# Patient Record
Sex: Female | Born: 1980 | Race: White | Hispanic: No | Marital: Married | Smoking: Former smoker
Health system: Southern US, Community
[De-identification: ages and names within clinical notes are randomized; demographics above are authoritative.]

## PROBLEM LIST (undated history)

## (undated) DIAGNOSIS — N200 Calculus of kidney: Secondary | ICD-10-CM

## (undated) DIAGNOSIS — G43019 Migraine without aura, intractable, without status migrainosus: Secondary | ICD-10-CM

## (undated) DIAGNOSIS — R109 Unspecified abdominal pain: Secondary | ICD-10-CM

## (undated) DIAGNOSIS — C801 Malignant (primary) neoplasm, unspecified: Secondary | ICD-10-CM

## (undated) DIAGNOSIS — G43909 Migraine, unspecified, not intractable, without status migrainosus: Secondary | ICD-10-CM

## (undated) DIAGNOSIS — Z227 Latent tuberculosis: Secondary | ICD-10-CM

## (undated) DIAGNOSIS — A64 Unspecified sexually transmitted disease: Secondary | ICD-10-CM

## (undated) DIAGNOSIS — C50919 Malignant neoplasm of unspecified site of unspecified female breast: Secondary | ICD-10-CM

## (undated) HISTORY — DX: Migraine, unspecified, not intractable, without status migrainosus: G43.909

## (undated) HISTORY — PX: BREAST EXCISIONAL BIOPSY: SUR124

## (undated) HISTORY — PX: OTHER SURGICAL HISTORY: SHX169

## (undated) HISTORY — DX: Unspecified abdominal pain: R10.9

## (undated) HISTORY — DX: Latent tuberculosis: Z22.7

## (undated) HISTORY — DX: Malignant neoplasm of unspecified site of unspecified female breast: C50.919

## (undated) HISTORY — DX: Calculus of kidney: N20.0

## (undated) HISTORY — DX: Unspecified sexually transmitted disease: A64

## (undated) HISTORY — DX: Migraine without aura, intractable, without status migrainosus: G43.019

---
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
## 1999-03-20 ENCOUNTER — Other Ambulatory Visit: Admission: RE | Admit: 1999-03-20 | Discharge: 1999-03-20 | Payer: Self-pay | Admitting: Obstetrics and Gynecology

## 1999-05-15 ENCOUNTER — Emergency Department (HOSPITAL_COMMUNITY): Admission: EM | Admit: 1999-05-15 | Discharge: 1999-05-15 | Payer: Self-pay | Admitting: Emergency Medicine

## 1999-08-09 ENCOUNTER — Emergency Department (HOSPITAL_COMMUNITY): Admission: EM | Admit: 1999-08-09 | Discharge: 1999-08-09 | Payer: Self-pay | Admitting: Emergency Medicine

## 1999-08-18 ENCOUNTER — Emergency Department (HOSPITAL_COMMUNITY): Admission: EM | Admit: 1999-08-18 | Discharge: 1999-08-18 | Payer: Self-pay | Admitting: Emergency Medicine

## 1999-08-19 ENCOUNTER — Encounter: Payer: Self-pay | Admitting: Emergency Medicine

## 2000-02-12 ENCOUNTER — Emergency Department (HOSPITAL_COMMUNITY): Admission: EM | Admit: 2000-02-12 | Discharge: 2000-02-12 | Payer: Self-pay | Admitting: *Deleted

## 2000-06-01 ENCOUNTER — Emergency Department (HOSPITAL_COMMUNITY): Admission: EM | Admit: 2000-06-01 | Discharge: 2000-06-01 | Payer: Self-pay | Admitting: Emergency Medicine

## 2000-09-22 ENCOUNTER — Emergency Department (HOSPITAL_COMMUNITY): Admission: EM | Admit: 2000-09-22 | Discharge: 2000-09-22 | Payer: Self-pay | Admitting: Emergency Medicine

## 2000-09-24 ENCOUNTER — Emergency Department (HOSPITAL_COMMUNITY): Admission: EM | Admit: 2000-09-24 | Discharge: 2000-09-25 | Payer: Self-pay

## 2000-09-24 ENCOUNTER — Encounter: Payer: Self-pay | Admitting: Emergency Medicine

## 2000-09-25 ENCOUNTER — Emergency Department (HOSPITAL_COMMUNITY): Admission: EM | Admit: 2000-09-25 | Discharge: 2000-09-25 | Payer: Self-pay | Admitting: *Deleted

## 2000-09-26 ENCOUNTER — Encounter: Payer: Self-pay | Admitting: *Deleted

## 2000-10-28 ENCOUNTER — Emergency Department (HOSPITAL_COMMUNITY): Admission: EM | Admit: 2000-10-28 | Discharge: 2000-10-29 | Payer: Self-pay | Admitting: Emergency Medicine

## 2000-11-12 ENCOUNTER — Inpatient Hospital Stay (HOSPITAL_COMMUNITY): Admission: EM | Admit: 2000-11-12 | Discharge: 2000-11-14 | Payer: Self-pay | Admitting: *Deleted

## 2001-01-01 ENCOUNTER — Inpatient Hospital Stay (HOSPITAL_COMMUNITY): Admission: AD | Admit: 2001-01-01 | Discharge: 2001-01-01 | Payer: Self-pay | Admitting: Obstetrics & Gynecology

## 2001-01-28 ENCOUNTER — Other Ambulatory Visit: Admission: RE | Admit: 2001-01-28 | Discharge: 2001-01-28 | Payer: Self-pay | Admitting: Obstetrics and Gynecology

## 2001-03-29 ENCOUNTER — Inpatient Hospital Stay (HOSPITAL_COMMUNITY): Admission: AD | Admit: 2001-03-29 | Discharge: 2001-03-29 | Payer: Self-pay | Admitting: Obstetrics and Gynecology

## 2001-07-01 ENCOUNTER — Inpatient Hospital Stay (HOSPITAL_COMMUNITY): Admission: AD | Admit: 2001-07-01 | Discharge: 2001-07-01 | Payer: Self-pay | Admitting: Obstetrics and Gynecology

## 2001-08-27 ENCOUNTER — Inpatient Hospital Stay (HOSPITAL_COMMUNITY): Admission: AD | Admit: 2001-08-27 | Discharge: 2001-08-30 | Payer: Self-pay | Admitting: *Deleted

## 2002-05-27 ENCOUNTER — Emergency Department (HOSPITAL_COMMUNITY): Admission: EM | Admit: 2002-05-27 | Discharge: 2002-05-27 | Payer: Self-pay | Admitting: Emergency Medicine

## 2002-05-27 ENCOUNTER — Encounter: Payer: Self-pay | Admitting: Emergency Medicine

## 2002-06-07 ENCOUNTER — Other Ambulatory Visit: Admission: RE | Admit: 2002-06-07 | Discharge: 2002-06-07 | Payer: Self-pay | Admitting: Obstetrics and Gynecology

## 2002-07-05 ENCOUNTER — Encounter: Admission: RE | Admit: 2002-07-05 | Discharge: 2002-07-05 | Payer: Self-pay | Admitting: Obstetrics and Gynecology

## 2002-07-05 ENCOUNTER — Encounter: Payer: Self-pay | Admitting: Obstetrics and Gynecology

## 2003-07-08 HISTORY — PX: BREAST SURGERY: SHX581

## 2003-10-18 ENCOUNTER — Other Ambulatory Visit: Admission: RE | Admit: 2003-10-18 | Discharge: 2003-10-18 | Payer: Self-pay | Admitting: Obstetrics and Gynecology

## 2003-10-27 ENCOUNTER — Encounter: Admission: RE | Admit: 2003-10-27 | Discharge: 2003-10-27 | Payer: Self-pay | Admitting: Obstetrics and Gynecology

## 2003-11-08 ENCOUNTER — Encounter (INDEPENDENT_AMBULATORY_CARE_PROVIDER_SITE_OTHER): Payer: Self-pay | Admitting: *Deleted

## 2003-11-08 ENCOUNTER — Ambulatory Visit (HOSPITAL_COMMUNITY): Admission: RE | Admit: 2003-11-08 | Discharge: 2003-11-08 | Payer: Self-pay | Admitting: *Deleted

## 2003-11-08 ENCOUNTER — Ambulatory Visit (HOSPITAL_BASED_OUTPATIENT_CLINIC_OR_DEPARTMENT_OTHER): Admission: RE | Admit: 2003-11-08 | Discharge: 2003-11-08 | Payer: Self-pay | Admitting: *Deleted

## 2004-01-30 ENCOUNTER — Other Ambulatory Visit: Payer: Self-pay

## 2004-07-13 ENCOUNTER — Emergency Department: Payer: Self-pay | Admitting: Emergency Medicine

## 2004-08-17 ENCOUNTER — Emergency Department (HOSPITAL_COMMUNITY): Admission: EM | Admit: 2004-08-17 | Discharge: 2004-08-17 | Payer: Self-pay | Admitting: Emergency Medicine

## 2004-12-07 ENCOUNTER — Emergency Department: Payer: Self-pay | Admitting: Emergency Medicine

## 2005-05-19 ENCOUNTER — Emergency Department (HOSPITAL_COMMUNITY): Admission: EM | Admit: 2005-05-19 | Discharge: 2005-05-19 | Payer: Self-pay | Admitting: Emergency Medicine

## 2005-11-05 ENCOUNTER — Encounter: Payer: Self-pay | Admitting: Emergency Medicine

## 2006-10-14 ENCOUNTER — Emergency Department (HOSPITAL_COMMUNITY): Admission: EM | Admit: 2006-10-14 | Discharge: 2006-10-14 | Payer: Self-pay | Admitting: Emergency Medicine

## 2006-11-15 ENCOUNTER — Emergency Department (HOSPITAL_COMMUNITY): Admission: EM | Admit: 2006-11-15 | Discharge: 2006-11-15 | Payer: Self-pay | Admitting: Emergency Medicine

## 2007-11-01 ENCOUNTER — Emergency Department: Payer: Self-pay | Admitting: Emergency Medicine

## 2007-11-10 ENCOUNTER — Ambulatory Visit: Payer: Self-pay | Admitting: Gastroenterology

## 2008-05-26 ENCOUNTER — Inpatient Hospital Stay (HOSPITAL_COMMUNITY): Admission: AD | Admit: 2008-05-26 | Discharge: 2008-05-26 | Payer: Self-pay | Admitting: Family Medicine

## 2008-05-26 ENCOUNTER — Ambulatory Visit: Payer: Self-pay | Admitting: Physician Assistant

## 2008-09-18 ENCOUNTER — Inpatient Hospital Stay (HOSPITAL_COMMUNITY): Admission: AD | Admit: 2008-09-18 | Discharge: 2008-09-18 | Payer: Self-pay | Admitting: Family Medicine

## 2008-11-11 ENCOUNTER — Emergency Department (HOSPITAL_COMMUNITY): Admission: EM | Admit: 2008-11-11 | Discharge: 2008-11-11 | Payer: Self-pay | Admitting: Obstetrics & Gynecology

## 2009-01-14 ENCOUNTER — Inpatient Hospital Stay (HOSPITAL_COMMUNITY): Admission: AD | Admit: 2009-01-14 | Discharge: 2009-01-15 | Payer: Self-pay | Admitting: Obstetrics & Gynecology

## 2009-01-14 ENCOUNTER — Ambulatory Visit: Payer: Self-pay | Admitting: Physician Assistant

## 2009-11-26 ENCOUNTER — Other Ambulatory Visit: Admission: RE | Admit: 2009-11-26 | Discharge: 2009-11-26 | Payer: Self-pay | Admitting: Obstetrics & Gynecology

## 2010-10-13 LAB — RH IMMUNE GLOB WKUP(>/=20WKS)(NOT WOMEN'S HOSP)

## 2010-10-13 LAB — CBC
Hemoglobin: 12.1 g/dL (ref 12.0–15.0)
MCHC: 34.7 g/dL (ref 30.0–36.0)
MCV: 92.5 fL (ref 78.0–100.0)
Platelets: 266 10*3/uL (ref 150–400)
RBC: 3.76 MIL/uL — ABNORMAL LOW (ref 3.87–5.11)
RDW: 14.3 % (ref 11.5–15.5)
WBC: 12.2 10*3/uL — ABNORMAL HIGH (ref 4.0–10.5)

## 2010-10-17 LAB — WET PREP, GENITAL
Clue Cells Wet Prep HPF POC: NONE SEEN
Trich, Wet Prep: NONE SEEN

## 2010-10-17 LAB — URINALYSIS, ROUTINE W REFLEX MICROSCOPIC
Bilirubin Urine: NEGATIVE
Specific Gravity, Urine: 1.01 (ref 1.005–1.030)
Urobilinogen, UA: 0.2 mg/dL (ref 0.0–1.0)

## 2010-10-17 LAB — GC/CHLAMYDIA PROBE AMP, GENITAL: GC Probe Amp, Genital: NEGATIVE

## 2010-10-30 ENCOUNTER — Emergency Department (HOSPITAL_COMMUNITY): Payer: Self-pay

## 2010-10-30 ENCOUNTER — Emergency Department (HOSPITAL_COMMUNITY)
Admission: EM | Admit: 2010-10-30 | Discharge: 2010-10-30 | Disposition: A | Discharge status: Home / Self Care | Payer: Self-pay | Attending: Emergency Medicine | Admitting: Emergency Medicine

## 2010-10-30 DIAGNOSIS — M79609 Pain in unspecified limb: Secondary | ICD-10-CM | POA: Insufficient documentation

## 2010-10-30 DIAGNOSIS — IMO0002 Reserved for concepts with insufficient information to code with codable children: Secondary | ICD-10-CM | POA: Insufficient documentation

## 2010-10-30 DIAGNOSIS — J45909 Unspecified asthma, uncomplicated: Secondary | ICD-10-CM | POA: Insufficient documentation

## 2010-10-30 MED ORDER — GADOBENATE DIMEGLUMINE 529 MG/ML IV SOLN: 10.0000 mL | Freq: Once | INTRAVENOUS | Status: DC | PRN
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
## 2010-11-22 NOTE — Op Note (Signed)
NAME:  EVANGELINE, UTLEY NO.:  [id number]   MEDICAL RECORD NO.:  [id number]                   PATIENT TYPE:  AMB   LOCATION:  DSC                                  FACILITY:  [facility]   PHYSICIAN:  Vikki Ports, M.D.         DATE OF BIRTH:  04-24-1981   DATE OF PROCEDURE:  11/08/2003  DATE OF DISCHARGE:                                 OPERATIVE REPORT   PREOPERATIVE DIAGNOSIS:  Right breast mass.   POSTOPERATIVE DIAGNOSIS:  Right breast mass.   PROCEDURE:  Excisional right breast biopsy.   SURGEON:  Vikki Ports, M.D.   ANESTHESIA:  Local MAC.   DESCRIPTION OF PROCEDURE:  The patient was taken to the operating room and  placed in a supine position.  After adequate MAC anesthesia was induced, the  right breast was prepped and draped in the normal sterile fashion.  Using 1%  lidocaine local anesthesia, the skin overlying the mass was anesthetized.  This was in the 9 o'clock region of the right breast.  A transverse incision  was made over the palpable mass, dissected down, excising the mass in its  entirety.  It was quite small but firm and sent for pathological evaluation.  Adequate hemostasis was assured.  The skin was closed with subcuticular 4-0  Monocryl.  Steri-Strips and sterile dressings were applied.  The patient  tolerated the procedure well and went to the PACU in good condition.                                               Vikki Ports, M.D.    KRH/MEDQ  D:  11/08/2003  T:  11/08/2003  Job:  [id number]
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
## 2011-03-18 ENCOUNTER — Other Ambulatory Visit (HOSPITAL_COMMUNITY)
Admission: RE | Admit: 2011-03-18 | Discharge: 2011-03-18 | Disposition: A | Discharge status: Home / Self Care | Payer: Self-pay | Source: Ambulatory Visit | Attending: Obstetrics & Gynecology | Admitting: Obstetrics & Gynecology

## 2011-03-18 ENCOUNTER — Other Ambulatory Visit: Payer: Self-pay | Admitting: Obstetrics & Gynecology

## 2011-03-18 DIAGNOSIS — Z01419 Encounter for gynecological examination (general) (routine) without abnormal findings: Secondary | ICD-10-CM | POA: Insufficient documentation

## 2011-04-08 LAB — CBC
Hemoglobin: 13.2
MCHC: 34.3
MCV: 91.1
Platelets: 264
RBC: 4.23
RDW: 13.4
WBC: 10.6 — ABNORMAL HIGH

## 2011-04-08 LAB — URINALYSIS, ROUTINE W REFLEX MICROSCOPIC
Bilirubin Urine: NEGATIVE
Glucose, UA: NEGATIVE
Hgb urine dipstick: NEGATIVE
Nitrite: NEGATIVE
Protein, ur: NEGATIVE
Specific Gravity, Urine: 1.01
Urobilinogen, UA: 0.2

## 2011-04-08 LAB — WET PREP, GENITAL: Yeast Wet Prep HPF POC: NONE SEEN

## 2011-08-24 ENCOUNTER — Emergency Department (INDEPENDENT_AMBULATORY_CARE_PROVIDER_SITE_OTHER): Payer: Commercial Managed Care - PPO

## 2011-08-24 ENCOUNTER — Encounter (HOSPITAL_COMMUNITY): Payer: Self-pay

## 2011-08-24 ENCOUNTER — Emergency Department (INDEPENDENT_AMBULATORY_CARE_PROVIDER_SITE_OTHER)
Admission: EM | Admit: 2011-08-24 | Discharge: 2011-08-24 | Disposition: A | Discharge status: Home / Self Care | Payer: Commercial Managed Care - PPO | Source: Home / Self Care | Attending: Family Medicine | Admitting: Family Medicine

## 2011-08-24 DIAGNOSIS — M546 Pain in thoracic spine: Secondary | ICD-10-CM

## 2011-08-24 MED ORDER — NAPROXEN 375 MG PO TABS: 375.0000 mg | ORAL_TABLET | Freq: Two times a day (BID) | ORAL | Status: DC

## 2011-08-24 MED ORDER — HYDROCODONE-ACETAMINOPHEN 5-325 MG PO TABS: ORAL_TABLET | ORAL | Status: AC

## 2011-08-24 NOTE — ED Notes (Signed)
Pt states she has pain between shoulder blades since last pm, no known injury.  Hurts to raise her arms.

## 2011-08-24 NOTE — ED Provider Notes (Signed)
History     CSN: [id number]  Arrival date & time 08/24/11  1310   First MD Initiated Contact with Patient 08/24/11 1420      Chief Complaint  Patient presents with  . Back Pain    (Consider location/radiation/quality/duration/timing/severity/associated sxs/prior treatment) HPI Comments: Dan presents for evaluation of back pain. She reports pain in the middle of her back in the thoracic area. She denies any injury. 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 Her 2 daughters. She denies any numbness, tingling, or weakness in her upper extremities. She does report pain and numbness in her right thigh. She reports a previous injury to her lumbar spine last year.  Patient is a 31 y.o. female presenting with back pain. The history is provided by the patient.  Back Pain  This is a new problem. The current episode started 6 to 12 hours ago. The problem occurs constantly. The problem has not changed since onset.The pain is associated with lifting heavy objects. The pain is present in the thoracic spine. The pain does not radiate. The symptoms are aggravated by bending and certain positions. Pertinent negatives include no numbness, no weight loss, no bladder incontinence, no paresthesias, no paresis, no tingling and no weakness. She has tried nothing for the symptoms.    Past Medical History  Diagnosis Date  . Asthma     History reviewed. No pertinent past surgical history.  History reviewed. No pertinent family history.  History  Substance Use Topics  . Smoking status: Never Smoker   . Smokeless tobacco: Not on file  . Alcohol Use: Yes    OB History    Grav Para Term Preterm Abortions TAB SAB Ect Mult Living                  Review of Systems  Constitutional: Negative.  Negative for weight loss.  HENT: Negative.   Eyes: Negative.   Respiratory: Negative.   Cardiovascular: Negative.   Gastrointestinal: Negative.     Genitourinary: Negative.  Negative for bladder incontinence.  Musculoskeletal: Positive for back pain.  Skin: Negative.   Neurological: Negative.  Negative for tingling, weakness, numbness and paresthesias.    Allergies  Penicillins  Home Medications   Current Outpatient Rx  Name Route Sig Dispense Refill  . HYDROCODONE-ACETAMINOPHEN 5-325 MG PO TABS  Take one to two tablets every 4 to 6 hours as needed for pain 20 tablet 0  . NAPROXEN 375 MG PO TABS Oral Take 1 tablet (375 mg total) by mouth 2 (two) times daily. 20 tablet 0    BP 105/68  Pulse 71  Temp(Src) 98.2 F (36.8 C) (Oral)  Resp 18  SpO2 100%  Physical Exam  Nursing note and vitals reviewed. Constitutional: She is oriented to person, place, and time. She appears well-developed and well-nourished.  HENT:  Head: Normocephalic and atraumatic.  Eyes: EOM are normal.  Neck: Normal range of motion.  Pulmonary/Chest: Effort normal.  Musculoskeletal: Normal range of motion.       Thoracic back: She exhibits tenderness, bony tenderness and pain.       Back:  Neurological: She is alert and oriented to person, place, and time.  Skin: Skin is warm and dry.  Psychiatric: Her behavior is normal.    ED Course  Procedures (including critical care time)  Labs Reviewed - No data to display Dg Thoracic Spine 2 View  08/24/2011  *RADIOLOGY REPORT*  Clinical Data:  Pain without trauma.  THORACIC SPINE - 2 VIEW  Comparison: None.  Findings: Frontal view demonstrates minimal S-shaped spinal curvature.  Normal paraspinous contours.  The swimmer's view demonstrates maintained vertebral body height through T5.  The lateral view images from approximately T1-L1. Maintenance of height across these levels. Intervertebral disc heights are maintained.  IMPRESSION: Minimal S-shaped spinal curvature. No acute osseous abnormality.  Original Report Authenticated By: Consuello Bossier, M.D.     1. Thoracic back pain       MDM  Xray reviewed  by radiologist and myself; no acute findings        Richardo Priest, MD 08/24/11 [phone]

## 2011-08-24 NOTE — Discharge Instructions (Signed)
Your x-ray was negative for any fracture. Take medications as directed. Use mild heat (heating pad, warm baths, etc) for 10 to 15 minutes, two to three times daily, as needed and as tolerated, taking care to not burn the skin. Begin stretches and exercises, as instructed in handouts, after 48 hours. Return to care should your symptoms not improve, or worsen in any way, such as numbness, weakness, or tingling, or inability to control urine or bowel movements; or follow up with the Orthopaedic provider listed on your discharge papers.

## 2012-02-13 ENCOUNTER — Emergency Department (HOSPITAL_COMMUNITY)
Admission: EM | Admit: 2012-02-13 | Discharge: 2012-02-13 | Disposition: A | Discharge status: Home / Self Care | Payer: Commercial Managed Care - PPO | Attending: Emergency Medicine | Admitting: Emergency Medicine

## 2012-02-13 ENCOUNTER — Encounter (HOSPITAL_COMMUNITY): Payer: Self-pay | Admitting: Emergency Medicine

## 2012-02-13 ENCOUNTER — Emergency Department (HOSPITAL_COMMUNITY): Payer: Commercial Managed Care - PPO

## 2012-02-13 DIAGNOSIS — R109 Unspecified abdominal pain: Secondary | ICD-10-CM

## 2012-02-13 DIAGNOSIS — J45909 Unspecified asthma, uncomplicated: Secondary | ICD-10-CM | POA: Insufficient documentation

## 2012-02-13 DIAGNOSIS — R1011 Right upper quadrant pain: Secondary | ICD-10-CM | POA: Insufficient documentation

## 2012-02-13 DIAGNOSIS — R079 Chest pain, unspecified: Secondary | ICD-10-CM

## 2012-02-13 DIAGNOSIS — Z7982 Long term (current) use of aspirin: Secondary | ICD-10-CM | POA: Insufficient documentation

## 2012-02-13 LAB — CBC
HCT: 39.3 % (ref 36.0–46.0)
Hemoglobin: 13.6 g/dL (ref 12.0–15.0)
MCHC: 34.6 g/dL (ref 30.0–36.0)
RDW: 13.2 % (ref 11.5–15.5)
WBC: 10.7 10*3/uL — ABNORMAL HIGH (ref 4.0–10.5)

## 2012-02-13 LAB — POCT I-STAT TROPONIN I

## 2012-02-13 LAB — POCT I-STAT, CHEM 8
Chloride: 106 mEq/L (ref 96–112)
Glucose, Bld: 107 mg/dL — ABNORMAL HIGH (ref 70–99)
HCT: 41 % (ref 36.0–46.0)
Hemoglobin: 13.9 g/dL (ref 12.0–15.0)
Potassium: 3.3 mEq/L — ABNORMAL LOW (ref 3.5–5.1)
Sodium: 141 mEq/L (ref 135–145)

## 2012-02-13 LAB — LIPASE, BLOOD: Lipase: 29 U/L (ref 11–59)

## 2012-02-13 LAB — HEPATIC FUNCTION PANEL
Albumin: 3.8 g/dL (ref 3.5–5.2)
Indirect Bilirubin: 0.4 mg/dL (ref 0.3–0.9)
Total Protein: 6.6 g/dL (ref 6.0–8.3)

## 2012-02-13 MED ORDER — ONDANSETRON HCL 4 MG/2ML IJ SOLN
4.0000 mg | Freq: Once | INTRAMUSCULAR | Status: AC
  Administered 2012-02-13: 4 mg via INTRAVENOUS
  Filled 2012-02-13: qty 2

## 2012-02-13 MED ORDER — HYDROMORPHONE HCL PF 1 MG/ML IJ SOLN
1.0000 mg | Freq: Once | INTRAMUSCULAR | Status: AC
Start: 2012-02-13 — End: 2012-02-13
  Administered 2012-02-13: 1 mg via INTRAVENOUS
  Filled 2012-02-13: qty 1

## 2012-02-13 MED ORDER — HYDROCODONE-ACETAMINOPHEN 5-325 MG PO TABS
ORAL_TABLET | ORAL | Status: AC
Start: 2012-02-13 — End: 2012-02-23

## 2012-02-13 MED ORDER — ONDANSETRON 8 MG PO TBDP: 8.0000 mg | ORAL_TABLET | Freq: Three times a day (TID) | ORAL | Status: AC | PRN

## 2012-02-13 NOTE — ED Notes (Signed)
Iv d'cd without incident. 

## 2012-02-13 NOTE — ED Provider Notes (Signed)
Medical screening examination/treatment/procedure(s) were performed by non-physician practitioner and as supervising physician I was immediately available for consultation/collaboration.   Richardean Canal, MD 02/13/12 2328

## 2012-02-13 NOTE — ED Notes (Signed)
Cp started yesterday; not really heartburn.  Some sob. And tightness. Mild dizziness. Hx. Of anxiety.

## 2012-02-13 NOTE — ED Provider Notes (Signed)
History     CSN: [id number]  Arrival date & time 02/13/12  1514   First MD Initiated Contact with Patient 02/13/12 2003      Chief Complaint  Patient presents with  . Chest Pain    (Consider location/radiation/quality/duration/timing/severity/associated sxs/prior treatment) HPI Comments: Patient presents with approximately 24-hour history of midsternal chest aching and upper abdominal pain. Patient called her primary care physician today and was told to come to the emergency department for further evaluation of her symptoms. Patient states the pain will radiate to her mid back. It is positioned with nausea. No diaphoresis, palpitations, significant shortness of breath. Patient has taken aspirin without relief of symptoms. Patient denies fever or changes in her bowel movements. The pain is not made worse with eating or deep breathing. Patient is negative. Onset was acute. Course is constant. Nothing makes the symptoms better or worse.  Patient is a 31 y.o. female presenting with chest pain. The history is provided by the patient.  Chest Pain The chest pain began yesterday. Chest pain occurs constantly. The chest pain is unchanged. The pain is associated with breathing. The quality of the pain is described as aching. The pain radiates to the mid back. Primary symptoms include abdominal pain and nausea. Pertinent negatives for primary symptoms include no fever, no shortness of breath, no cough, no palpitations and no vomiting.  Pertinent negatives for associated symptoms include no diaphoresis. She tried aspirin for the symptoms.  Pertinent negatives for past medical history include no diabetes, no DVT, no hyperlipidemia and no hypertension.  Pertinent negatives for family medical history include: no early MI in family and no PE in family.     Past Medical History  Diagnosis Date  . Asthma     No past surgical history on file.  No family history on file.  History  Substance Use Topics   . Smoking status: Never Smoker   . Smokeless tobacco: Not on file  . Alcohol Use: Yes    OB History    Grav Para Term Preterm Abortions TAB SAB Ect Mult Living                  Review of Systems  Constitutional: Negative for fever and diaphoresis.  HENT: Negative for sore throat, rhinorrhea and neck pain.   Eyes: Negative for redness.  Respiratory: Negative for cough and shortness of breath.   Cardiovascular: Positive for chest pain. Negative for palpitations and leg swelling.  Gastrointestinal: Positive for nausea and abdominal pain. Negative for vomiting and diarrhea.  Genitourinary: Negative for dysuria.  Musculoskeletal: Negative for myalgias and back pain.  Skin: Negative for rash.  Neurological: Negative for syncope, light-headedness and headaches.    Allergies  Penicillins  Home Medications   Current Outpatient Rx  Name Route Sig Dispense Refill  . ASPIRIN 81 MG PO CHEW Oral Chew 324 mg by mouth daily as needed. For chest pain      BP 103/68  Pulse 59  Temp 98.1 F (36.7 C) (Oral)  Resp 17  SpO2 98%  Physical Exam  Nursing note and vitals reviewed. Constitutional: She appears well-developed and well-nourished.  HENT:  Head: Normocephalic and atraumatic.  Eyes: Conjunctivae are normal. Right eye exhibits no discharge. Left eye exhibits no discharge.  Neck: Normal range of motion. Neck supple.  Cardiovascular: Normal rate, regular rhythm and normal heart sounds.   Pulmonary/Chest: Effort normal and breath sounds normal. No respiratory distress. She exhibits no tenderness.  Abdominal: Soft. Bowel sounds are  normal. She exhibits no distension. There is tenderness in the right upper quadrant and epigastric area. There is no rigidity, no rebound, no guarding, no CVA tenderness, no tenderness at McBurney's point and negative Murphy's sign.    Musculoskeletal: She exhibits no edema and no tenderness.       No lower extremity tenderness or swelling.     Neurological: She is alert.  Skin: Skin is warm and dry.  Psychiatric: She has a normal mood and affect.    ED Course  Procedures (including critical care time)  Labs Reviewed  CBC - Abnormal; Notable for the following:    WBC 10.7 (*)     All other components within normal limits  POCT I-STAT, CHEM 8 - Abnormal; Notable for the following:    Potassium 3.3 (*)     Glucose, Bld 107 (*)     All other components within normal limits  POCT I-STAT TROPONIN I  HEPATIC FUNCTION PANEL  LIPASE, BLOOD   Dg Chest 2 View  02/13/2012  *RADIOLOGY REPORT*  Clinical Data: Chest pain and shortness of breath  CHEST - 2 VIEW  Comparison: None  Findings: The heart size and mediastinal contours are within normal limits.  Both lungs are clear.  The visualized skeletal structures are unremarkable.  IMPRESSION: Negative exam.  Original Report Authenticated By: Rosealee Albee, M.D.   US Abdomen Complete  02/13/2012  *RADIOLOGY REPORT*  Clinical Data:  Right upper quadrant pain  COMPLETE ABDOMINAL ULTRASOUND  Comparison:  None.  Findings:  Gallbladder:  No gallstones, gallbladder wall thickening, or pericholecystic fluid.  Common bile duct:  Normal at 2 mm  Liver:  No focal lesion identified.  Within normal limits in parenchymal echogenicity.  IVC:  Appears normal.  Pancreas:  No focal abnormality seen.  Spleen:  Normal in size and echogenicity.  Right Kidney:  11.2cm in length.  No evidence of hydronephrosis or stones.  Left Kidney:  11.2cm in length.  No evidence of hydronephrosis or stones.  Abdominal aorta:  No aneurysm identified.  IMPRESSION: Normal abdominal ultrasound.  Original Report Authenticated By: Genevive Bi, M.D.     1. Abdominal pain   2. Chest pain     8:43 PM Patient seen and examined. Additional labs and [place] ordered. Medications ordered.  Will move to CDU.    Date: 02/13/2012  Rate:87  Rhythm: normal sinus rhythm and sinus arrhythmia  QRS Axis: normal  Intervals: normal  ST/T  Wave abnormalities: normal  Conduction Disutrbances:none  Narrative Interpretation:   Old EKG Reviewed: none available    Vital signs reviewed and are as follows: Filed Vitals:   02/13/12 1947  BP: 103/68  Pulse: 59  Temp:   Resp: 17   Patient never made it to CDU. Ultrasound and blood tests findings reviewed by myself and reviewed with patient. She is feeling improved after pain medicine and nausea medicine. She is reassured.  The patient was urged to return to the Emergency Department immediately with worsening of current symptoms, worsening abdominal pain, persistent vomiting, blood noted in stools, fever, or any other concerns. The patient verbalized understanding.   Patient was counseled to return with severe chest pain, especially if the pain is crushing or pressure-like and spreads to the arms, back, neck, or jaw, or if they have sweating, nausea, or shortness of breath with the pain. They were encouraged to call 911 with these symptoms.   They were also told to return if their chest pain gets worse and does  not go away with rest, they have an attack of chest pain lasting longer than usual despite rest and treatment with the medications their caregiver has prescribed, if they wake from sleep with chest pain or shortness of breath, if they feel dizzy or faint, if they have chest pain not typical of their usual pain, or if they have any other emergent concerns regarding their health.  The patient verbalized understanding and agreed.   Patient counseled on use of narcotic pain medications. Counseled not to combine these medications with others containing tylenol. Urged not to drink alcohol, drive, or perform any other activities that requires focus while taking these medications. The patient verbalizes understanding and agrees with the plan.   MDM  Patient with lower sternal chest pain and upper abdominal pain. Her risk factor profile is low risk for CAD. EKG normal and troponin  negative. Do not suspect ACS. Patient is PERC negative. Do not suspect PE. Patient had right upper quadrant tenderness. This was further evaluated with blood tests and an ultrasound was negative. Do not suspect cholecystitis, pancreatitis. Patient appears well at current time. She is afebrile. Her symptoms are controlled in emergency department. She is tolerating orals. She can be properly managed as an outpatient with her primary care physician.         Renne Crigler, [place] 02/13/12 2308

## 2012-02-13 NOTE — ED Notes (Signed)
Pt to ultrasound at this time.

## 2012-05-06 ENCOUNTER — Ambulatory Visit (INDEPENDENT_AMBULATORY_CARE_PROVIDER_SITE_OTHER): Payer: Commercial Managed Care - PPO | Admitting: Otolaryngology

## 2012-05-06 DIAGNOSIS — H6120 Impacted cerumen, unspecified ear: Secondary | ICD-10-CM

## 2012-05-06 DIAGNOSIS — H9209 Otalgia, unspecified ear: Secondary | ICD-10-CM

## 2012-07-07 DIAGNOSIS — Z227 Latent tuberculosis: Secondary | ICD-10-CM

## 2012-07-07 HISTORY — DX: Latent tuberculosis: Z22.7

## 2013-05-10 ENCOUNTER — Ambulatory Visit: Payer: Self-pay | Admitting: Neurology

## 2013-10-11 ENCOUNTER — Encounter: Payer: Self-pay | Admitting: Obstetrics & Gynecology

## 2013-10-11 ENCOUNTER — Other Ambulatory Visit (HOSPITAL_COMMUNITY)
Admission: RE | Admit: 2013-10-11 | Discharge: 2013-10-11 | Disposition: A | Discharge status: Home / Self Care | Payer: Commercial Managed Care - PPO | Source: Ambulatory Visit | Attending: Obstetrics & Gynecology | Admitting: Obstetrics & Gynecology

## 2013-10-11 ENCOUNTER — Ambulatory Visit (INDEPENDENT_AMBULATORY_CARE_PROVIDER_SITE_OTHER): Payer: Commercial Managed Care - PPO | Admitting: Obstetrics & Gynecology

## 2013-10-11 VITALS — BP 90/60 | Ht 63.4 in | Wt 121.0 lb

## 2013-10-11 DIAGNOSIS — Z01419 Encounter for gynecological examination (general) (routine) without abnormal findings: Secondary | ICD-10-CM

## 2013-10-11 DIAGNOSIS — Z1151 Encounter for screening for human papillomavirus (HPV): Secondary | ICD-10-CM | POA: Insufficient documentation

## 2013-10-11 NOTE — Progress Notes (Signed)
Patient ID: Tammy Johnson, female   DOB: Dec 31, 1980, 33 y.o.   MRN: [id number] Subjective:     Tammy Johnson is a 33 y.o. female here for a routine exam.  Patient's last menstrual period was 09/29/2013. No obstetric history on file. Birth Control Method: condoms Menstrual Calendar(currently): irregular timing, irregular volume  Current complaints: irregular periods.   Current acute medical issues:  migraines   Recent Gynecologic History Patient's last menstrual period was 09/29/2013. Last Pap: 2013 ,  normal Last mammogram: 2005,  Breast cyst  Past Medical History  Diagnosis Date  . Asthma   . Migraine     Past Surgical History  Procedure Laterality Date  . Rt breast mass    . Laparoscopic endometriosis fulguration      OB History   Grav Para Term Preterm Abortions TAB SAB Ect Mult Living                  History   Social History  . Marital Status: Married    Spouse Name: N/A    Number of Children: N/A  . Years of Education: N/A   Social History Main Topics  . Smoking status: Never Smoker   . Smokeless tobacco: None  . Alcohol Use: Yes  . Drug Use: No  . Sexual Activity: Yes   Other Topics Concern  . None   Social History Narrative  . None    Family History  Problem Relation Age of Onset  . Cervical cancer Maternal Grandmother   . Heart disease Maternal Grandmother      Review of Systems  Review of Systems  Constitutional: Negative for fever, chills, weight loss, malaise/fatigue and diaphoresis.  HENT: Negative for hearing loss, ear pain, nosebleeds, congestion, sore throat, neck pain, tinnitus and ear discharge.   Eyes: Negative for blurred vision, double vision, photophobia, pain, discharge and redness.  Respiratory: Negative for cough, hemoptysis, sputum production, shortness of breath, wheezing and stridor.   Cardiovascular: Negative for chest pain, palpitations, orthopnea, claudication, leg swelling and PND.  Gastrointestinal: negative for  abdominal pain. Negative for heartburn, nausea, vomiting, diarrhea, constipation, blood in stool and melena.  Genitourinary: Negative for dysuria, urgency, frequency, hematuria and flank pain.  Musculoskeletal: Negative for myalgias, back pain, joint pain and falls.  Skin: Negative for itching and rash.  Neurological: Negative for dizziness, tingling, tremors, sensory change, speech change, focal weakness, seizures, loss of consciousness, weakness and headaches.  Endo/Heme/Allergies: Negative for environmental allergies and polydipsia. Does not bruise/bleed easily.  Psychiatric/Behavioral: Negative for depression, suicidal ideas, hallucinations, memory loss and substance abuse. The patient is not nervous/anxious and does not have insomnia.        Objective:    Physical Exam  Vitals reviewed. Constitutional: She is oriented to person, place, and time. She appears well-developed and well-nourished.  HENT:  Head: Normocephalic and atraumatic.        Right Ear: External ear normal.  Left Ear: External ear normal.  Nose: Nose normal.  Mouth/Throat: Oropharynx is clear and moist.  Eyes: Conjunctivae and EOM are normal. Pupils are equal, round, and reactive to light. Right eye exhibits no discharge. Left eye exhibits no discharge. No scleral icterus.  Neck: Normal range of motion. Neck supple. No tracheal deviation present. No thyromegaly present.  Cardiovascular: Normal rate, regular rhythm, normal heart sounds and intact distal pulses.  Exam reveals no gallop and no friction rub.   No murmur heard. Respiratory: Effort normal and breath sounds normal. No respiratory distress. She  has no wheezes. She has no rales. She exhibits no tenderness.  GI: Soft. Bowel sounds are normal. She exhibits no distension and no mass. There is no tenderness. There is no rebound and no guarding.  Genitourinary:  Breasts no masses skin changes or nipple changes bilaterally      Vulva is normal without  lesions Vagina is pink moist without discharge Cervix normal in appearance and pap is done Uterus is normal size shape and contour Adnexa is negative with normal sized ovaries   Musculoskeletal: Normal range of motion. She exhibits no edema and no tenderness.  Neurological: She is alert and oriented to person, place, and time. She has normal reflexes. She displays normal reflexes. No cranial nerve deficit. She exhibits normal muscle tone. Coordination normal.  Skin: Skin is warm and dry. No rash noted. No erythema. No pallor.  Psychiatric: She has a normal mood and affect. Her behavior is normal. Judgment and thought content normal.       Assessment:    Healthy female exam.   Migraine sufferer on prophylaxis Plan:    Follow up in: 1 month.   Use topical estorogen to see if we can positively impact her migraines  Elestrin smaple given GEES 10/2014

## 2013-10-18 DIAGNOSIS — Z227 Latent tuberculosis: Secondary | ICD-10-CM | POA: Insufficient documentation

## 2013-10-18 DIAGNOSIS — N200 Calculus of kidney: Secondary | ICD-10-CM | POA: Insufficient documentation

## 2013-10-18 DIAGNOSIS — A64 Unspecified sexually transmitted disease: Secondary | ICD-10-CM

## 2013-10-18 DIAGNOSIS — C50919 Malignant neoplasm of unspecified site of unspecified female breast: Secondary | ICD-10-CM | POA: Insufficient documentation

## 2013-10-18 HISTORY — DX: Calculus of kidney: N20.0

## 2013-10-18 HISTORY — DX: Unspecified sexually transmitted disease: A64

## 2013-10-18 HISTORY — DX: Latent tuberculosis: Z22.7

## 2013-11-03 ENCOUNTER — Encounter (HOSPITAL_COMMUNITY): Payer: Self-pay | Admitting: Emergency Medicine

## 2013-11-03 ENCOUNTER — Emergency Department (HOSPITAL_COMMUNITY)
Admission: EM | Admit: 2013-11-03 | Discharge: 2013-11-04 | Disposition: A | Discharge status: Home / Self Care | Payer: Commercial Managed Care - PPO | Attending: Emergency Medicine | Admitting: Emergency Medicine

## 2013-11-03 ENCOUNTER — Emergency Department (HOSPITAL_COMMUNITY): Payer: Commercial Managed Care - PPO

## 2013-11-03 DIAGNOSIS — R109 Unspecified abdominal pain: Secondary | ICD-10-CM

## 2013-11-03 DIAGNOSIS — Z880 Allergy status to penicillin: Secondary | ICD-10-CM | POA: Insufficient documentation

## 2013-11-03 DIAGNOSIS — Z8669 Personal history of other diseases of the nervous system and sense organs: Secondary | ICD-10-CM | POA: Insufficient documentation

## 2013-11-03 DIAGNOSIS — Z91040 Latex allergy status: Secondary | ICD-10-CM | POA: Insufficient documentation

## 2013-11-03 DIAGNOSIS — J45909 Unspecified asthma, uncomplicated: Secondary | ICD-10-CM | POA: Insufficient documentation

## 2013-11-03 DIAGNOSIS — N898 Other specified noninflammatory disorders of vagina: Secondary | ICD-10-CM | POA: Insufficient documentation

## 2013-11-03 DIAGNOSIS — Z8611 Personal history of tuberculosis: Secondary | ICD-10-CM | POA: Insufficient documentation

## 2013-11-03 DIAGNOSIS — Z7982 Long term (current) use of aspirin: Secondary | ICD-10-CM | POA: Insufficient documentation

## 2013-11-03 DIAGNOSIS — R1033 Periumbilical pain: Secondary | ICD-10-CM | POA: Insufficient documentation

## 2013-11-03 DIAGNOSIS — Z3202 Encounter for pregnancy test, result negative: Secondary | ICD-10-CM | POA: Insufficient documentation

## 2013-11-03 DIAGNOSIS — Z79899 Other long term (current) drug therapy: Secondary | ICD-10-CM | POA: Insufficient documentation

## 2013-11-03 DIAGNOSIS — R110 Nausea: Secondary | ICD-10-CM | POA: Insufficient documentation

## 2013-11-03 HISTORY — DX: Latent tuberculosis: Z22.7

## 2013-11-03 LAB — CBC WITH DIFFERENTIAL/PLATELET
BASOS ABS: 0 10*3/uL (ref 0.0–0.1)
Basophils Relative: 0 % (ref 0–1)
Eosinophils Absolute: 0.1 10*3/uL (ref 0.0–0.7)
Eosinophils Relative: 1 % (ref 0–5)
HCT: 39.2 % (ref 36.0–46.0)
Hemoglobin: 13.5 g/dL (ref 12.0–15.0)
LYMPHS ABS: 2.8 10*3/uL (ref 0.7–4.0)
LYMPHS PCT: 31 % (ref 12–46)
MCH: 30.1 pg (ref 26.0–34.0)
MCHC: 34.4 g/dL (ref 30.0–36.0)
MCV: 87.3 fL (ref 78.0–100.0)
Monocytes Absolute: 0.6 10*3/uL (ref 0.1–1.0)
Monocytes Relative: 7 % (ref 3–12)
NEUTROS PCT: 61 % (ref 43–77)
Neutro Abs: 5.5 10*3/uL (ref 1.7–7.7)
PLATELETS: 262 10*3/uL (ref 150–400)
RBC: 4.49 MIL/uL (ref 3.87–5.11)
RDW: 13 % (ref 11.5–15.5)
WBC: 9 10*3/uL (ref 4.0–10.5)

## 2013-11-03 LAB — COMPREHENSIVE METABOLIC PANEL
ALK PHOS: 65 U/L (ref 39–117)
ALT: 8 U/L (ref 0–35)
AST: 10 U/L (ref 0–37)
Albumin: 3.9 g/dL (ref 3.5–5.2)
BUN: 8 mg/dL (ref 6–23)
CO2: 25 meq/L (ref 19–32)
Calcium: 9.1 mg/dL (ref 8.4–10.5)
Chloride: 103 mEq/L (ref 96–112)
Creatinine, Ser: 0.71 mg/dL (ref 0.50–1.10)
GLUCOSE: 89 mg/dL (ref 70–99)
POTASSIUM: 3.5 meq/L — AB (ref 3.7–5.3)
SODIUM: 140 meq/L (ref 137–147)
TOTAL PROTEIN: 6.7 g/dL (ref 6.0–8.3)
Total Bilirubin: 0.3 mg/dL (ref 0.3–1.2)

## 2013-11-03 LAB — URINALYSIS, ROUTINE W REFLEX MICROSCOPIC
BILIRUBIN URINE: NEGATIVE
Glucose, UA: NEGATIVE mg/dL
KETONES UR: NEGATIVE mg/dL
Leukocytes, UA: NEGATIVE
NITRITE: NEGATIVE
PH: 5.5 (ref 5.0–8.0)
Protein, ur: NEGATIVE mg/dL
Specific Gravity, Urine: <1.005 — ABNORMAL LOW (ref 1.005–1.030)
Urobilinogen, UA: 0.2 mg/dL (ref 0.0–1.0)

## 2013-11-03 LAB — WET PREP, GENITAL
Trich, Wet Prep: NONE SEEN
Yeast Wet Prep HPF POC: NONE SEEN

## 2013-11-03 LAB — URINE MICROSCOPIC-ADD ON

## 2013-11-03 LAB — POC URINE PREG, ED: Preg Test, Ur: NEGATIVE

## 2013-11-03 MED ORDER — ONDANSETRON HCL 4 MG/2ML IJ SOLN
4.0000 mg | Freq: Once | INTRAMUSCULAR | Status: AC
  Administered 2013-11-03: 4 mg via INTRAVENOUS
  Filled 2013-11-03: qty 2

## 2013-11-03 MED ORDER — MORPHINE SULFATE 4 MG/ML IJ SOLN
4.0000 mg | Freq: Once | INTRAMUSCULAR | Status: AC
  Administered 2013-11-03: 4 mg via INTRAVENOUS
  Filled 2013-11-03: qty 1

## 2013-11-03 MED ORDER — IOHEXOL 300 MG/ML  SOLN
50.0000 mL | Freq: Once | INTRAMUSCULAR | Status: AC | PRN
  Administered 2013-11-03: 50 mL via ORAL

## 2013-11-03 NOTE — ED Notes (Signed)
Pt c/o abdominal pain starting yesterday. States it started out as dull pain behind her belly button, now c/o sharp pain that has moved to her RLQ. Reports nausea with the pain but denies emesis.

## 2013-11-03 NOTE — ED Provider Notes (Signed)
CSN: [id number]     Arrival date & time 11/03/13  2057 History   First MD Initiated Contact with Patient 11/03/13 2113     Chief Complaint  Patient presents with  . Abdominal Pain     (Consider location/radiation/quality/duration/timing/severity/associated sxs/prior Treatment) HPI Comments:      The history is provided by the patient.    Tammy Johnson is a 33 y.o. female presenting with right lower quadrant pain which started yesterday around noon in the umbilical area,  Described as a "gas like" sensation,  Has been persistent and now has localized to the right lower quadrant and is more painful and sharp.  She has had nausea and anorexia without emesis and no documented fevers but felt warm yesterday.  Her last bm was this morning, normal and did not alter her pain.  She denies dysuria, hematuria, vaginal pain or discharge.  She has just finished her menses and has had a small amount of spotting.  Pain is worse with movement and palpation and better at rest.     Past Medical History  Diagnosis Date  . Asthma   . Migraine   . TB lung, latent    Past Surgical History  Procedure Laterality Date  . Rt breast mass    . Laparoscopic endometriosis fulguration     Family History  Problem Relation Age of Onset  . Cervical cancer Maternal Grandmother   . Heart disease Maternal Grandmother    History  Substance Use Topics  . Smoking status: Never Smoker   . Smokeless tobacco: Not on file  . Alcohol Use: Yes   OB History   Grav Para Term Preterm Abortions TAB SAB Ect Mult Living                 Review of Systems  Constitutional: Positive for appetite change. Negative for fever and chills.       Felt "hot" yesterday.  HENT: Negative for congestion and sore throat.   Eyes: Negative.   Respiratory: Negative for chest tightness and shortness of breath.   Cardiovascular: Negative for chest pain.  Gastrointestinal: Positive for nausea and abdominal pain. Negative for  vomiting, diarrhea and constipation.  Genitourinary: Negative.   Musculoskeletal: Negative for arthralgias, joint swelling and neck pain.  Skin: Negative.  Negative for rash and wound.  Neurological: Negative for dizziness, weakness, light-headedness, numbness and headaches.  Psychiatric/Behavioral: Negative.       Allergies  Bee venom; Penicillins; and Latex  Home Medications   Prior to Admission medications   Medication Sig Start Date End Date Taking? Authorizing Provider  aspirin 81 MG chewable tablet Chew 324 mg by mouth daily as needed. For chest pain    Historical Provider, MD  gabapentin (NEURONTIN) 600 MG tablet Take 600 mg by mouth 3 (three) times daily.    Historical Provider, MD   BP 99/57  Pulse 74  Temp(Src) 98 F (36.7 C) (Oral)  Resp 18  Ht 5\' 5"  (1.651 m)  Wt 135 lb (61.236 kg)  BMI 22.47 kg/m2  SpO2 99%  LMP 10/24/2013 Physical Exam  Nursing note and vitals reviewed. Constitutional: She appears well-developed and well-nourished.  HENT:  Head: Normocephalic and atraumatic.  Eyes: Conjunctivae are normal.  Neck: Normal range of motion.  Cardiovascular: Normal rate, regular rhythm, normal heart sounds and intact distal pulses.   Pulmonary/Chest: Effort normal and breath sounds normal. She has no wheezes.  Abdominal: Soft. Bowel sounds are normal. She exhibits distension. There is tenderness in  the right lower quadrant. There is guarding and tenderness at McBurney's point. There is no rebound.  Slight distention and increased tympany to percussion upper quadrants.  Genitourinary: Uterus normal. Uterus is not enlarged and not tender. Cervix exhibits no motion tenderness and no discharge. Right adnexum displays no mass, no tenderness and no fullness. Left adnexum displays no mass, no tenderness and no fullness. Vaginal discharge found.  Scant dark brown dc in vagina.  Musculoskeletal: Normal range of motion.  Neurological: She is alert.  Skin: Skin is warm and  dry.  Psychiatric: She has a normal mood and affect.    ED Course  Procedures (including critical care time) Labs Review Labs Reviewed  WET PREP, GENITAL - Abnormal; Notable for the following:    Clue Cells Wet Prep HPF POC FEW (*)    WBC, Wet Prep HPF POC FEW (*)    All other components within normal limits  COMPREHENSIVE METABOLIC PANEL - Abnormal; Notable for the following:    Potassium 3.5 (*)    All other components within normal limits  URINALYSIS, ROUTINE W REFLEX MICROSCOPIC - Abnormal; Notable for the following:    Specific Gravity, Urine <1.005 (*)    Hgb urine dipstick TRACE (*)    All other components within normal limits  GC/CHLAMYDIA PROBE AMP  CBC WITH DIFFERENTIAL  URINE MICROSCOPIC-ADD ON  RPR  POC URINE PREG, ED    Imaging Review Ct Abdomen Pelvis W Contrast  11/04/2013   CLINICAL DATA:  Right lower quadrant abdominal pain.  EXAM: CT ABDOMEN AND PELVIS WITH CONTRAST  TECHNIQUE: Multidetector CT imaging of the abdomen and pelvis was performed using the standard protocol following bolus administration of intravenous contrast.  CONTRAST:  100 mL of Omnipaque 300 IV contrast  COMPARISON:  MRI of the lumbar spine performed 10/30/2010, and abdominal ultrasound performed 02/13/2012  FINDINGS: The visualized lung bases are clear.  The liver and spleen are unremarkable in appearance. The gallbladder is within normal limits. The pancreas and adrenal glands are unremarkable.  The kidneys are unremarkable in appearance. There is no evidence of hydronephrosis. No renal or ureteral stones are seen. No perinephric stranding is appreciated.  No free fluid is identified. The small bowel is unremarkable in appearance. The stomach is within normal limits. No acute vascular abnormalities are seen.  The appendix is normal in caliber, without evidence for appendicitis. Contrast progresses to the level of the descending colon. The colon is unremarkable in appearance.  The bladder is mildly  distended and grossly unremarkable. The uterus is within normal limits. The ovaries are relatively symmetric. No suspicious adnexal masses are seen. No inguinal lymphadenopathy is seen.  No acute osseous abnormalities are identified.  IMPRESSION: No acute abnormality seen within the abdomen or pelvis.   Electronically Signed   By: Garald Balding M.D.   On: 11/04/2013 01:24     EKG Interpretation None      MDM   Final diagnoses:  Abdominal pain    Patients labs and/or radiological studies were viewed and considered during the medical decision making and disposition process. Results discussed with patient.  She was advised to f/u with her pcp for a recheck today.  In the interim,  Was given a pre pack of hydrocodone, but was advised to use sparingly so she can recognize if sx are worsened.  She understands and agrees with plan.  At re-exam her abdomen is more tympanitic than at first exam.  She denies flatulence while here.  Possible pain source  is gas trapping.  Was given dose of simethicone prior to dc home,  Encouraged to take at home if this medicine is helpful.     Evalee Jefferson, PA-C 11/04/13 0202

## 2013-11-03 NOTE — ED Provider Notes (Signed)
Patient relates she started getting periumbilical dull aching abdominal pain 2 evenings ago. She states it persisted yesterday. She states today it started moving into her right lower quadrant. She states any type of movement and driving in the car makes the pain worse. She states she wants to steak rolled up in a fetal position for comfort. She's had decreased appetite today. She has nausea without vomiting. She's unsure of fever. She states she's never had this before.  Patient alert and cooperative. She states her pain is improved after getting pain medicine in the ED. Abdominal exam deferred to the PA.  Medical screening examination/treatment/procedure(s) were conducted as a shared visit with non-physician practitioner(s) and myself.  I personally evaluated the patient during the encounter.   EKG Interpretation None       Rolland Porter, MD, [facility] Sander   Janice Norrie, MD 11/03/13 2316

## 2013-11-04 LAB — RPR

## 2013-11-04 MED ORDER — HYDROCODONE-ACETAMINOPHEN 5-325 MG PO TABS: 1.0000 | ORAL_TABLET | ORAL | Status: DC | PRN

## 2013-11-04 MED ORDER — IOHEXOL 300 MG/ML  SOLN
100.0000 mL | Freq: Once | INTRAMUSCULAR | Status: AC | PRN
  Administered 2013-11-04: 100 mL via INTRAVENOUS

## 2013-11-04 MED ORDER — SIMETHICONE 80 MG PO CHEW
80.0000 mg | CHEWABLE_TABLET | Freq: Once | ORAL | Status: AC
  Administered 2013-11-04: 80 mg via ORAL
  Filled 2013-11-04: qty 1

## 2013-11-04 MED ORDER — MORPHINE SULFATE 4 MG/ML IJ SOLN
4.0000 mg | Freq: Once | INTRAMUSCULAR | Status: AC
  Administered 2013-11-04: 4 mg via INTRAVENOUS
  Filled 2013-11-04: qty 1

## 2013-11-04 NOTE — Discharge Instructions (Signed)
Abdominal Pain, Adult Many things can cause abdominal pain. Usually, abdominal pain is not caused by a disease and will improve without treatment. It can often be observed and treated at home. Your health care provider will do a physical exam and possibly order blood tests and X-rays to help determine the seriousness of your pain. However, in many cases, more time must pass before a clear cause of the pain can be found. Before that point, your health care provider may not know if you need more testing or further treatment. HOME CARE INSTRUCTIONS  Monitor your abdominal pain for any changes. The following actions may help to alleviate any discomfort you are experiencing:  Only take over-the-counter or prescription medicines as directed by your health care provider.  Do not take laxatives unless directed to do so by your health care provider.  Try a clear liquid diet (broth, tea, or water) as directed by your health care provider. Slowly move to a bland diet as tolerated. SEEK MEDICAL CARE IF:  You have unexplained abdominal pain.  You have abdominal pain associated with nausea or diarrhea.  You have pain when you urinate or have a bowel movement.  You experience abdominal pain that wakes you in the night.  You have abdominal pain that is worsened or improved by eating food.  You have abdominal pain that is worsened with eating fatty foods. SEEK IMMEDIATE MEDICAL CARE IF:   Your pain does not go away within 2 hours.  You have a fever.  You keep throwing up (vomiting).  Your pain is felt only in portions of the abdomen, such as the right side or the left lower portion of the abdomen.  You pass bloody or black tarry stools. MAKE SURE YOU:  Understand these instructions.   Will watch your condition.   Will get help right away if you are not doing well or get worse.  Document Released: 04/02/2005 Document Revised: 04/13/2013 Document Reviewed: 03/02/2013 [facility] Patient  Information 2014 [place].   Your tests including your blood tests and  Ct scan are normal with no source of your pain discovered, as discussed.  Call your doctor for a recheck of your symptoms tomorrow.  You may use the medicine given for pain relief, but use this sparingly as discussed.  You need to be able to recognize if your symptoms are getting worse.  Also, do not drive within 4 hours of taking this medicine as it will make you sleepy.

## 2013-11-04 NOTE — ED Notes (Signed)
Pt was discharged with pre-pack quantity:6 Hydrocodone-Acetaminophen.

## 2013-11-05 LAB — GC/CHLAMYDIA PROBE AMP
CT PROBE, AMP APTIMA: NEGATIVE
GC PROBE AMP APTIMA: NEGATIVE

## 2013-11-07 MED FILL — Hydrocodone-Acetaminophen Tab 5-325 MG: ORAL | Qty: 6 | Status: AC

## 2013-11-07 NOTE — ED Provider Notes (Signed)
See prior note   Janice Norrie, MD 11/07/13 [phone]

## 2013-11-10 ENCOUNTER — Ambulatory Visit (INDEPENDENT_AMBULATORY_CARE_PROVIDER_SITE_OTHER): Payer: Commercial Managed Care - PPO | Admitting: Obstetrics & Gynecology

## 2013-11-10 ENCOUNTER — Ambulatory Visit: Payer: Commercial Managed Care - PPO | Admitting: Obstetrics & Gynecology

## 2013-11-10 ENCOUNTER — Encounter: Payer: Self-pay | Admitting: Obstetrics & Gynecology

## 2013-11-10 VITALS — BP 118/70 | Ht 65.0 in | Wt 119.0 lb

## 2013-11-10 DIAGNOSIS — G43909 Migraine, unspecified, not intractable, without status migrainosus: Secondary | ICD-10-CM

## 2013-11-13 DIAGNOSIS — G43909 Migraine, unspecified, not intractable, without status migrainosus: Secondary | ICD-10-CM | POA: Insufficient documentation

## 2013-11-13 HISTORY — DX: Migraine, unspecified, not intractable, without status migrainosus: G43.909

## 2013-11-13 NOTE — Progress Notes (Signed)
Patient ID: Tammy Johnson, female   DOB: [birth year], 33 y.o.   MRN: [id number] Pt is seen in follow up At the time of her previous visit which was a yearly examination, she relayed to me that she was continuing to have a predictable headache at the time of her menstrual cycle onset I was suspicious that she despite having prophylaxis for her migraines throughout the month was having estrogen withdrawal vascular spasm headaches As a result I placed her on topical Elestrin to be used just prior to the onset of her menses  According to her report that strategy has been successful As a result we will continue with the cyclical supplemental estrogen and she is given samples  Additionally the patient was recently seen in the emergency room for right lower quadrant pain which the patient describes as severe and sharp A thorough workup has essentially been negative She does have a history of kidney stones and its my guess that that was the genesis of her pain Certainly the CT scan reveals noted gynecologic source and I do not see a reason to do another scan at this point All of her labs were normal All the patient's questions regarding this episode were answered  Past Medical History  Diagnosis Date  . Asthma   . Migraine   . TB lung, latent     Past Surgical History  Procedure Laterality Date  . Rt breast mass    . Laparoscopic endometriosis fulguration      OB History   Grav Para Term Preterm Abortions TAB SAB Ect Mult Living                  Allergies  Allergen Reactions  . Bee Venom Anaphylaxis  . Penicillins Anaphylaxis  . Latex Swelling    History   Social History  . Marital Status: Married    Spouse Name: N/A    Number of Children: N/A  . Years of Education: N/A   Social History Main Topics  . Smoking status: Never Smoker   . Smokeless tobacco: Never Used  . Alcohol Use: No  . Drug Use: No  . Sexual Activity: Yes    Birth Control/ Protection: None   Other  Topics Concern  . None   Social History Narrative  . None    Family History  Problem Relation Age of Onset  . Cervical cancer Maternal Grandmother   . Heart disease Maternal Grandmother   . Cancer Mother     breast   . Depression Brother   . Bipolar disorder Brother   . Schizophrenia Brother

## 2014-01-11 DIAGNOSIS — G43019 Migraine without aura, intractable, without status migrainosus: Secondary | ICD-10-CM | POA: Insufficient documentation

## 2014-01-11 HISTORY — DX: Migraine without aura, intractable, without status migrainosus: G43.019

## 2014-11-21 ENCOUNTER — Emergency Department
Admission: EM | Admit: 2014-11-21 | Discharge: 2014-11-21 | Disposition: A | Discharge status: Home / Self Care | Payer: Commercial Managed Care - PPO | Attending: Internal Medicine | Admitting: Internal Medicine

## 2014-11-21 ENCOUNTER — Encounter: Payer: Self-pay | Admitting: Emergency Medicine

## 2014-11-21 ENCOUNTER — Emergency Department: Payer: Commercial Managed Care - PPO

## 2014-11-21 DIAGNOSIS — S4992XA Unspecified injury of left shoulder and upper arm, initial encounter: Secondary | ICD-10-CM | POA: Diagnosis not present

## 2014-11-21 DIAGNOSIS — Z7982 Long term (current) use of aspirin: Secondary | ICD-10-CM | POA: Diagnosis not present

## 2014-11-21 DIAGNOSIS — S39012A Strain of muscle, fascia and tendon of lower back, initial encounter: Secondary | ICD-10-CM

## 2014-11-21 DIAGNOSIS — S4991XA Unspecified injury of right shoulder and upper arm, initial encounter: Secondary | ICD-10-CM | POA: Diagnosis not present

## 2014-11-21 DIAGNOSIS — Z880 Allergy status to penicillin: Secondary | ICD-10-CM | POA: Insufficient documentation

## 2014-11-21 DIAGNOSIS — S29012A Strain of muscle and tendon of back wall of thorax, initial encounter: Secondary | ICD-10-CM | POA: Diagnosis not present

## 2014-11-21 DIAGNOSIS — S169XXA Unspecified injury of muscle, fascia and tendon at neck level, initial encounter: Secondary | ICD-10-CM | POA: Diagnosis not present

## 2014-11-21 DIAGNOSIS — Z793 Long term (current) use of hormonal contraceptives: Secondary | ICD-10-CM | POA: Diagnosis not present

## 2014-11-21 DIAGNOSIS — S199XXA Unspecified injury of neck, initial encounter: Secondary | ICD-10-CM | POA: Diagnosis present

## 2014-11-21 DIAGNOSIS — Y998 Other external cause status: Secondary | ICD-10-CM | POA: Diagnosis not present

## 2014-11-21 DIAGNOSIS — Y9389 Activity, other specified: Secondary | ICD-10-CM | POA: Insufficient documentation

## 2014-11-21 DIAGNOSIS — Z7951 Long term (current) use of inhaled steroids: Secondary | ICD-10-CM | POA: Diagnosis not present

## 2014-11-21 DIAGNOSIS — Z91040 Latex allergy status: Secondary | ICD-10-CM | POA: Diagnosis not present

## 2014-11-21 DIAGNOSIS — S161XXA Strain of muscle, fascia and tendon at neck level, initial encounter: Secondary | ICD-10-CM

## 2014-11-21 DIAGNOSIS — Z79899 Other long term (current) drug therapy: Secondary | ICD-10-CM | POA: Insufficient documentation

## 2014-11-21 DIAGNOSIS — Y92410 Unspecified street and highway as the place of occurrence of the external cause: Secondary | ICD-10-CM | POA: Insufficient documentation

## 2014-11-21 MED ORDER — CYCLOBENZAPRINE HCL 5 MG PO TABS: 5.0000 mg | ORAL_TABLET | Freq: Three times a day (TID) | ORAL | Status: DC | PRN
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
## 2014-11-21 NOTE — Discharge Instructions (Signed)
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 stomach muscles and slowly return one leg at a time to the floor. Repeat __________ times. Complete this exercise __________ times per day.  STRETCH - Low Trunk Rotation  Lie on a firm bed or floor. Keeping your legs in front of you, bend your knees so they are both pointed toward the ceiling and your feet are flat on the floor.  Extend your arms out to the side. This will stabilize your upper body by keeping your shoulders in contact with the floor.  Gently and slowly drop both knees together to one side until you feel a gentle stretch in your low back. Hold for __________ seconds.  Tense your stomach muscles to support your lower back as you bring your knees back to the starting position. Repeat the exercise to the other side. Repeat __________ times. Complete this exercise __________ times per day  EXTENSION RANGE OF MOTION AND FLEXIBILITY EXERCISES: STRETCH - Extension, Prone on Elbows   Lie on your stomach on the floor, a bed will be too soft. Place your palms about shoulder width  apart and at the height of your head.  Place your elbows under your shoulders. If  this is too painful, stack pillows under your chest.  Allow your body to relax so that your hips drop lower and make contact more completely with the floor.  Hold this position for __________ seconds.  Slowly return to lying flat on the floor. Repeat __________ times. Complete this exercise __________ times per day.  RANGE OF MOTION - Extension, Prone Press Ups  Lie on your stomach on the floor, a bed will be too soft. Place your palms about shoulder width apart and at the height of your head.  Keeping your back as relaxed as possible, slowly straighten your elbows while keeping your hips on the floor. You may adjust the placement of your hands to maximize your comfort. As you gain motion, your hands will come more underneath your shoulders.  Hold this position __________ seconds.  Slowly return to lying flat on the floor. Repeat __________ times. Complete this exercise __________ times per day.  RANGE OF MOTION- Quadruped, Neutral Spine   Assume a hands and knees position on a firm surface. Keep your hands under your shoulders and your knees under your hips. You may place padding under your knees for comfort.  Drop your head and point your tailbone toward the ground below you. This will round out your lower back like an angry cat. Hold this position for __________ seconds.  Slowly lift your head and release your tail bone so that your back sags into a large arch, like an old horse.  Hold this position for __________ seconds.  Repeat this until you feel limber in your low back.  Now, find your "sweet spot." This will be the most comfortable position somewhere between the two previous positions. This is your neutral spine. Once you have found this position, tense your stomach muscles to support your low back.  Hold this position for __________ seconds. Repeat __________ times. Complete this exercise  __________ times per day.  STRENGTHENING EXERCISES - Low Back Sprain These exercises may help you when beginning to rehabilitate your injury. These exercises should be done near your "sweet spot." This is the neutral, low-back arch, somewhere between fully rounded and fully arched, that is your least painful position. When performed in this safe range of motion, these exercises can be used for people who have either a flexion or extension based injury. These exercises may resolve your symptoms with or without further involvement from your physician, physical therapist or athletic trainer. While completing these exercises, remember:   Muscles can gain both the endurance and the strength needed for everyday activities through controlled exercises.  Complete these exercises as instructed by your physician, physical therapist or athletic trainer. Increase the resistance and repetitions only as guided.  You may experience muscle soreness or fatigue, but the pain or discomfort you are trying to eliminate should never worsen during these exercises. If this pain does worsen, stop and make certain you are following the directions exactly. If the pain is still present after adjustments, discontinue the exercise until you can discuss the trouble with your caregiver. STRENGTHENING - Deep Abdominals, Pelvic Tilt   Lie on a firm bed or floor. Keeping your legs in front of you, bend your knees so they are both pointed toward the ceiling and your feet are flat on the floor.  Tense your lower abdominal muscles to press your low back into the floor. This motion will rotate your pelvis so that your tail bone is scooping upwards rather than pointing at your feet or into the floor.  With a gentle tension and even breathing, hold this position for __________ seconds. Repeat __________ times. Complete this exercise __________ times per day.  STRENGTHENING - Abdominals, Crunches   Lie on a firm bed or floor. Keeping your  legs in front of you, bend your knees so they are both pointed toward the ceiling and your feet are flat on the floor. Cross your arms over your chest.  Slightly tip your chin down without bending your neck.  Tense your abdominals and slowly lift your trunk high enough to just clear your shoulder blades. Lifting higher can put excessive stress on the lower back and does not further strengthen your abdominal muscles.  Control your return to the starting position. Repeat __________ times. Complete this exercise __________ times per day.  STRENGTHENING - Quadruped, Opposite UE/LE Lift   Assume a hands and knees position on a firm surface. Keep your hands under your shoulders and your knees under your hips. You may place padding under your knees for comfort.  Find your neutral spine and gently tense your abdominal muscles so that you can maintain this position. Your shoulders and hips should form a rectangle that is parallel with the floor and is not twisted.  Keeping your trunk steady, lift your right hand no higher than your shoulder and then your left leg no higher than your hip. Make sure you are not holding your breath. Hold this position for __________ seconds.  Continuing to keep your abdominal muscles tense and your back steady, slowly return to your starting position. Repeat with the opposite arm and leg. Repeat __________ times. Complete this exercise __________ times per day.  STRENGTHENING - Abdominals and Quadriceps, Straight Leg Raise   Lie on a firm bed or floor with both legs extended in front of you.  Keeping one leg in contact with the floor, bend the other knee so that your foot can rest flat on the floor.  Find your neutral spine, and tense your abdominal muscles to maintain your spinal position throughout the exercise.  Slowly lift your straight leg off the floor about 6 inches for a count of 15, making sure to not hold your breath.  Still keeping your neutral spine,  slowly lower your leg all the way to the floor. Repeat this exercise with each leg __________ times. Complete this exercise __________ times per day. POSTURE AND BODY MECHANICS CONSIDERATIONS - Low Back Sprain Keeping correct posture when sitting, standing or completing your activities will reduce the stress put on different body tissues, allowing injured tissues a chance to heal and limiting painful experiences. The following are general guidelines for improved posture. Your physician or physical therapist will provide you with any instructions specific to your needs. While reading these guidelines, remember:  The exercises prescribed by your provider will help you have the flexibility and strength to maintain correct postures.  The correct posture provides the best environment for your joints to work. All of your joints have less wear and tear when properly supported by a spine with good posture. This means you will experience a healthier, less painful body.  Correct posture must be practiced with all of your activities, especially prolonged sitting and standing. Correct posture is as important when doing repetitive low-stress activities (typing) as it is when doing a single heavy-load activity (lifting). RESTING POSITIONS Consider which positions are most painful for you when choosing a resting position. If you have pain with flexion-based activities (sitting, bending, stooping, squatting), choose a position that allows  you to rest in a less flexed posture. You would want to avoid curling into a fetal position on your side. If your pain worsens with extension-based activities (prolonged standing, working overhead), avoid resting in an extended position such as sleeping on your stomach. Most people will find more comfort when they rest with their spine in a more neutral position, neither too rounded nor too arched. Lying on a non-sagging bed on your side with a pillow between your knees, or on your back  with a pillow under your knees will often provide some relief. Keep in mind, being in any one position for a prolonged period of time, no matter how correct your posture, can still lead to stiffness. PROPER SITTING POSTURE In order to minimize stress and discomfort on your spine, you must sit with correct posture. Sitting with good posture should be effortless for a healthy body. Returning to good posture is a gradual process. Many people can work toward this most comfortably by using various supports until they have the flexibility and strength to maintain this posture on their own. When sitting with proper posture, your ears will fall over your shoulders and your shoulders will fall over your hips. You should use the back of the chair to support your upper back. Your lower back will be in a neutral position, just slightly arched. You may place a small pillow or folded towel at the base of your lower back for  support.  When working at a desk, create an environment that supports good, upright posture. Without extra support, muscles tire, which leads to excessive strain on joints and other tissues. Keep these recommendations in mind: CHAIR:  A chair should be able to slide under your desk when your back makes contact with the back of the chair. This allows you to work closely.  The chair's height should allow your eyes to be level with the upper part of your monitor and your hands to be slightly lower than your elbows. BODY POSITION  Your feet should make contact with the floor. If this is not possible, use a foot rest.  Keep your ears over your shoulders. This will reduce stress on your neck and low back. INCORRECT SITTING POSTURES  If you are feeling tired and unable to assume a healthy sitting posture, do not slouch or slump. This puts excessive strain on your back tissues, causing more damage and pain. Healthier options include:  Using more support, like a lumbar pillow.  Switching tasks to  something that requires you to be upright or walking.  Talking a brief walk.  Lying down to rest in a neutral-spine position. PROLONGED STANDING WHILE SLIGHTLY LEANING FORWARD  When completing a task that requires you to lean forward while standing in one place for a long time, place either foot up on a stationary 2-4 inch high object to help maintain the best posture. When both feet are on the ground, the lower back tends to lose its slight inward curve. If this curve flattens (or becomes too large), then the back and your other joints will experience too much stress, tire more quickly, and can cause pain. CORRECT STANDING POSTURES Proper standing posture should be assumed with all daily activities, even if they only take a few moments, like when brushing your teeth. As in sitting, your ears should fall over your shoulders and your shoulders should fall over your hips. You should keep a slight tension in your abdominal muscles to brace your spine. Your tailbone should  point down to the ground, not behind your body, resulting in an over-extended swayback posture.  INCORRECT STANDING POSTURES  Common incorrect standing postures include a forward head, locked knees and/or an excessive swayback. WALKING Walk with an upright posture. Your ears, shoulders and hips should all line-up. PROLONGED ACTIVITY IN A FLEXED POSITION When completing a task that requires you to bend forward at your waist or lean over a low surface, try to find a way to stabilize 3 out of 4 of your limbs. You can place a hand or elbow on your thigh or rest a knee on the surface you are reaching across. This will provide you more stability, so that your muscles do not tire as quickly. By keeping your knees relaxed, or slightly bent, you will also reduce stress across your lower back. CORRECT LIFTING TECHNIQUES DO :  Assume a wide stance. This will provide you more stability and the opportunity to get as close as possible to the  object which you are lifting.  Tense your abdominals to brace your spine. Bend at the knees and hips. Keeping your back locked in a neutral-spine position, lift using your leg muscles. Lift with your legs, keeping your back straight.  Test the weight of unknown objects before attempting to lift them.  Try to keep your elbows locked down at your sides in order get the best strength from your shoulders when carrying an object.  Always ask for help when lifting heavy or awkward objects. INCORRECT LIFTING TECHNIQUES DO NOT:   Lock your knees when lifting, even if it is a small object.  Bend and twist. Pivot at your feet or move your feet when needing to change directions.  Assume that you can safely pick up even a paperclip without proper posture. Document Released: 06/23/2005 Document Revised: 09/15/2011 Document Reviewed: 10/05/2008 [facility] Patient Information 2015 [place], [place]. This information is not intended to replace advice given to you by your health care provider. Make sure you discuss any questions you have with your health care provider.

## 2015-02-27 ENCOUNTER — Encounter (HOSPITAL_COMMUNITY): Payer: Self-pay | Admitting: *Deleted

## 2015-02-27 ENCOUNTER — Emergency Department (HOSPITAL_COMMUNITY)
Admission: EM | Admit: 2015-02-27 | Discharge: 2015-02-27 | Disposition: A | Discharge status: Home / Self Care | Payer: Commercial Managed Care - PPO | Attending: Emergency Medicine | Admitting: Emergency Medicine

## 2015-02-27 DIAGNOSIS — Z859 Personal history of malignant neoplasm, unspecified: Secondary | ICD-10-CM | POA: Insufficient documentation

## 2015-02-27 DIAGNOSIS — G43909 Migraine, unspecified, not intractable, without status migrainosus: Secondary | ICD-10-CM | POA: Diagnosis not present

## 2015-02-27 DIAGNOSIS — Z720 Tobacco use: Secondary | ICD-10-CM | POA: Diagnosis not present

## 2015-02-27 DIAGNOSIS — Z7951 Long term (current) use of inhaled steroids: Secondary | ICD-10-CM | POA: Insufficient documentation

## 2015-02-27 DIAGNOSIS — L02619 Cutaneous abscess of unspecified foot: Secondary | ICD-10-CM

## 2015-02-27 DIAGNOSIS — J45909 Unspecified asthma, uncomplicated: Secondary | ICD-10-CM | POA: Diagnosis not present

## 2015-02-27 DIAGNOSIS — L02612 Cutaneous abscess of left foot: Secondary | ICD-10-CM | POA: Insufficient documentation

## 2015-02-27 DIAGNOSIS — Z79899 Other long term (current) drug therapy: Secondary | ICD-10-CM | POA: Diagnosis not present

## 2015-02-27 DIAGNOSIS — Z880 Allergy status to penicillin: Secondary | ICD-10-CM | POA: Insufficient documentation

## 2015-02-27 DIAGNOSIS — M7989 Other specified soft tissue disorders: Secondary | ICD-10-CM | POA: Diagnosis present

## 2015-02-27 DIAGNOSIS — Z91040 Latex allergy status: Secondary | ICD-10-CM | POA: Insufficient documentation

## 2015-02-27 HISTORY — DX: Malignant (primary) neoplasm, unspecified: C80.1

## 2015-02-27 MED ORDER — DOXYCYCLINE HYCLATE 100 MG PO TABS
100.0000 mg | ORAL_TABLET | Freq: Once | ORAL | Status: AC
  Administered 2015-02-27: 100 mg via ORAL
  Filled 2015-02-27: qty 1

## 2015-02-27 MED ORDER — IBUPROFEN 800 MG PO TABS
800.0000 mg | ORAL_TABLET | Freq: Once | ORAL | Status: AC
  Administered 2015-02-27: 800 mg via ORAL
  Filled 2015-02-27: qty 1

## 2015-02-27 MED ORDER — HYDROCODONE-ACETAMINOPHEN 5-325 MG PO TABS: 1.0000 | ORAL_TABLET | ORAL | Status: DC | PRN

## 2015-02-27 MED ORDER — DOXYCYCLINE HYCLATE 100 MG PO CAPS: 100.0000 mg | ORAL_CAPSULE | Freq: Two times a day (BID) | ORAL | Status: DC

## 2015-02-27 NOTE — ED Notes (Signed)
Pt states she was wearing boots and felt something bite her left outer foot (at her heel). Pt couldn't get in to see her doctor for 2 weeks. Pt has pain when applying her weight to her right foot. No other symptoms noted.

## 2015-02-27 NOTE — ED Provider Notes (Signed)
CSN: [id number]     Arrival date & time 02/27/15  1646 History   First MD Initiated Contact with Patient 02/27/15 1755     Chief Complaint  Patient presents with  . Insect Bite     (Consider location/radiation/quality/duration/timing/severity/associated sxs/prior Treatment) HPI Comments: Patient is a 34 year old female who presents to the emergency department with an infected bite to her left foot. 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 She also states that she is currently in school at the [employer] and is doing a lot of walking these first few days of school. The patient states that she has noticed a raised red area with a blister in the center on her left outer foot and heel. She has tried conservative measures including peroxide and soaks. She has tried Tylenol and ibuprofen but states the problem seems to be getting worse. She states that on yesterday she particularly noticed a significant amount of swelling after having been on the foot quite a bit during the day. No high fever reported. No drainage from the site noted. No reported puncture wounds to the foot. She does state however she wears boots almost daily, and at times she does not wear socks.  The history is provided by the patient.    Past Medical History  Diagnosis Date  . Asthma   . Migraine   . TB lung, latent   . Cancer     right side    Past Surgical History  Procedure Laterality Date  . Rt breast mass    . Laparoscopic endometriosis fulguration     Family History  Problem Relation Age of Onset  . Cervical cancer Maternal Grandmother   . Heart disease Maternal Grandmother   . Cancer Mother     breast   . Depression Brother   . Bipolar disorder Brother   . Schizophrenia Brother    Social History  Substance Use Topics  . Smoking status: Current Some Day Smoker  . Smokeless tobacco: Never Used  . Alcohol Use: No   OB History    No data available     Review of Systems   Constitutional: Negative for activity change.       All ROS Neg except as noted in HPI  HENT: Negative for nosebleeds.   Eyes: Negative for photophobia and discharge.  Respiratory: Negative for cough, shortness of breath and wheezing.   Cardiovascular: Negative for chest pain and palpitations.  Gastrointestinal: Negative for abdominal pain and blood in stool.  Genitourinary: Negative for dysuria, frequency and hematuria.  Musculoskeletal: Negative for back pain, arthralgias and neck pain.  Skin: Positive for wound.  Neurological: Positive for headaches. Negative for dizziness, seizures and speech difficulty.  Psychiatric/Behavioral: Negative for hallucinations and confusion.      Allergies  Bee venom; Penicillins; and Latex  Home Medications   Prior to Admission medications   Medication Sig Start Date End Date Taking? Authorizing Provider  cetirizine (ZYRTEC) 10 MG tablet Take 10 mg by mouth daily.   Yes Historical Provider, MD  EPINEPHrine (EPIPEN 2-PAK) 0.3 mg/0.3 mL IJ SOAJ injection Inject 0.3 mg into the muscle once.   Yes Historical Provider, MD  fluticasone (FLONASE) 50 MCG/ACT nasal spray Place 1 spray into both nostrils daily.   Yes Historical Provider, MD  cyclobenzaprine (FLEXERIL) 5 MG tablet Take 1 tablet (5 mg total) by mouth every 8 (eight) hours as needed for muscle spasms (or pain. Do not drive or operate machinery while  taking as can cause drowsiness.). Patient not taking: Reported on 02/27/2015 11/21/14   Marylene Land, NP  doxycycline (VIBRAMYCIN) 100 MG capsule Take 1 capsule (100 mg total) by mouth 2 (two) times daily. 02/27/15   Lily Kocher, PA-C  HYDROcodone-acetaminophen (NORCO/VICODIN) 5-325 MG per tablet Take 1 tablet by mouth every 4 (four) hours as needed. 02/27/15   Lily Kocher, PA-C   BP 134/78 mmHg  Pulse 88  Temp(Src) 98.1 F (36.7 C) (Oral)  Resp 18  Ht 5\' 6"  (1.676 m)  Wt 140 lb (63.504 kg)  BMI 22.61 kg/m2  SpO2 100%  LMP  02/26/2015 Physical Exam  Constitutional: She is oriented to person, place, and time. She appears well-developed and well-nourished.  Non-toxic appearance.  HENT:  Head: Normocephalic.  Right Ear: Tympanic membrane and external ear normal.  Left Ear: Tympanic membrane and external ear normal.  Eyes: EOM and lids are normal. Pupils are equal, round, and reactive to light.  Neck: Normal range of motion. Neck supple. Carotid bruit is not present.  Cardiovascular: Normal rate, regular rhythm, normal heart sounds, intact distal pulses and normal pulses.   Pulmonary/Chest: Breath sounds normal. No respiratory distress.  Abdominal: Soft. Bowel sounds are normal. There is no tenderness. There is no guarding.  Musculoskeletal: Normal range of motion.       Left hip: She exhibits tenderness.  There is an area of increased redness slightly raised with a small blister in the center at the medial aspect of the left heel. There is increased redness around the site. The area is not hot. There is no drainage.  There is full range of motion of the toes, and ankle. The Achilles tendon is intact. There no puncture wounds to the plantar surface of the foot. There no lesions between the toes.  Lymphadenopathy:       Head (right side): No submandibular adenopathy present.       Head (left side): No submandibular adenopathy present.    She has no cervical adenopathy.  Neurological: She is alert and oriented to person, place, and time. She has normal strength. No cranial nerve deficit or sensory deficit.  Skin: Skin is warm and dry.  Psychiatric: She has a normal mood and affect. Her speech is normal.  Nursing note and vitals reviewed.   ED Course  Procedures (including critical care time) Labs Review Labs Reviewed - No data to display  Imaging Review No results found. I have personally reviewed and evaluated these images and lab results as part of my medical decision-making.   EKG Interpretation None       MDM  Vital signs within normal limits. The patient has an abscess area to the heel of the left foot. The patient is fitted with a postoperative shoe and crutches. Prescription for doxycycline and Norco given to the patient. The patient will use Tylenol or ibuprofen for less severe pain. The patient will follow with her primary physician, or return to the emergency department if infection seems to be advancing.    Final diagnoses:  Abscess of foot    **I have reviewed nursing notes, vital signs, and all appropriate lab and imaging results for this patient.Lily Kocher, PA-C [id number]  Delora Fuel, MD 51/02/58 [id number]

## 2015-02-27 NOTE — Discharge Instructions (Signed)
Please cleanse the abscess area of your foot and apply a protective dressing or Band-Aid. Please use the postoperative shoe and crutches until you can safely apply weight to your  foot. Use Tylenol every 4 hours, or ibuprofen every 6 hours for mild pain, use Norco for more severe pain. Norco may cause drowsiness, please do not drive, RN machinery, drink alcohol, or participate in activities requiring concentration when taking this medication. Please use doxycycline 2 times daily with food until all taken. See your primary physician, or return to the emergency department if the infection seems to be advancing. Abscess An abscess (boil or furuncle) is an infected area on or under the skin. This area is filled with yellowish-white fluid (pus) and other material (debris). HOME CARE   Only take medicines as told by your doctor.  If you were given antibiotic medicine, take it as directed. Finish the medicine even if you start to feel better.  If gauze is used, follow your doctor's directions for changing the gauze.  To avoid spreading the infection:  Keep your abscess covered with a bandage.  Wash your hands well.  Do not share personal care items, towels, or whirlpools with others.  Avoid skin contact with others.  Keep your skin and clothes clean around the abscess.  Keep all doctor visits as told. GET HELP RIGHT AWAY IF:   You have more pain, puffiness (swelling), or redness in the wound site.  You have more fluid or blood coming from the wound site.  You have muscle aches, chills, or you feel sick.  You have a fever. MAKE SURE YOU:   Understand these instructions.  Will watch your condition.  Will get help right away if you are not doing well or get worse. Document Released: 12/10/2007 Document Revised: 12/23/2011 Document Reviewed: 09/05/2011 [facility] Patient Information 2015 [place], [place]. This information is not intended to replace advice given to you by your health care  provider. Make sure you discuss any questions you have with your health care provider.

## 2015-04-10 ENCOUNTER — Encounter (HOSPITAL_COMMUNITY): Payer: Self-pay | Admitting: Emergency Medicine

## 2015-04-10 ENCOUNTER — Emergency Department (HOSPITAL_COMMUNITY)
Admission: EM | Admit: 2015-04-10 | Discharge: 2015-04-10 | Disposition: A | Discharge status: Home / Self Care | Payer: Commercial Managed Care - PPO | Attending: Emergency Medicine | Admitting: Emergency Medicine

## 2015-04-10 DIAGNOSIS — Z720 Tobacco use: Secondary | ICD-10-CM | POA: Diagnosis not present

## 2015-04-10 DIAGNOSIS — J45909 Unspecified asthma, uncomplicated: Secondary | ICD-10-CM | POA: Diagnosis not present

## 2015-04-10 DIAGNOSIS — R000 Tachycardia, unspecified: Secondary | ICD-10-CM | POA: Insufficient documentation

## 2015-04-10 DIAGNOSIS — F419 Anxiety disorder, unspecified: Secondary | ICD-10-CM | POA: Diagnosis not present

## 2015-04-10 DIAGNOSIS — Z880 Allergy status to penicillin: Secondary | ICD-10-CM | POA: Insufficient documentation

## 2015-04-10 DIAGNOSIS — Y9389 Activity, other specified: Secondary | ICD-10-CM | POA: Diagnosis not present

## 2015-04-10 DIAGNOSIS — Y9289 Other specified places as the place of occurrence of the external cause: Secondary | ICD-10-CM | POA: Insufficient documentation

## 2015-04-10 DIAGNOSIS — Z7951 Long term (current) use of inhaled steroids: Secondary | ICD-10-CM | POA: Insufficient documentation

## 2015-04-10 DIAGNOSIS — Z91040 Latex allergy status: Secondary | ICD-10-CM | POA: Diagnosis not present

## 2015-04-10 DIAGNOSIS — T7840XA Allergy, unspecified, initial encounter: Secondary | ICD-10-CM

## 2015-04-10 DIAGNOSIS — R590 Localized enlarged lymph nodes: Secondary | ICD-10-CM | POA: Diagnosis not present

## 2015-04-10 DIAGNOSIS — Z8679 Personal history of other diseases of the circulatory system: Secondary | ICD-10-CM | POA: Insufficient documentation

## 2015-04-10 DIAGNOSIS — Y998 Other external cause status: Secondary | ICD-10-CM | POA: Insufficient documentation

## 2015-04-10 DIAGNOSIS — Z859 Personal history of malignant neoplasm, unspecified: Secondary | ICD-10-CM | POA: Insufficient documentation

## 2015-04-10 DIAGNOSIS — Z79899 Other long term (current) drug therapy: Secondary | ICD-10-CM | POA: Diagnosis not present

## 2015-04-10 DIAGNOSIS — R21 Rash and other nonspecific skin eruption: Secondary | ICD-10-CM | POA: Diagnosis present

## 2015-04-10 DIAGNOSIS — X58XXXA Exposure to other specified factors, initial encounter: Secondary | ICD-10-CM | POA: Diagnosis not present

## 2015-04-10 DIAGNOSIS — Z792 Long term (current) use of antibiotics: Secondary | ICD-10-CM | POA: Diagnosis not present

## 2015-04-10 LAB — BASIC METABOLIC PANEL
Anion gap: 7 (ref 5–15)
BUN: 10 mg/dL (ref 6–20)
CALCIUM: 9 mg/dL (ref 8.9–10.3)
CO2: 23 mmol/L (ref 22–32)
Chloride: 108 mmol/L (ref 101–111)
Creatinine, Ser: 0.83 mg/dL (ref 0.44–1.00)
GFR calc Af Amer: >60 mL/min (ref >60)
Glucose, Bld: 98 mg/dL (ref 65–99)
Potassium: 3.4 mmol/L — ABNORMAL LOW (ref 3.5–5.1)
Sodium: 138 mmol/L (ref 135–145)

## 2015-04-10 LAB — CBC WITH DIFFERENTIAL/PLATELET
BASOS ABS: 0 10*3/uL (ref 0.0–0.1)
Basophils Relative: 0 %
EOS PCT: 2 %
Eosinophils Absolute: 0.1 10*3/uL (ref 0.0–0.7)
HCT: 37.9 % (ref 36.0–46.0)
Hemoglobin: 12.5 g/dL (ref 12.0–15.0)
Lymphocytes Relative: 12 %
Lymphs Abs: 1 10*3/uL (ref 0.7–4.0)
MCH: 29.6 pg (ref 26.0–34.0)
MCHC: 33 g/dL (ref 30.0–36.0)
MCV: 89.6 fL (ref 78.0–100.0)
Monocytes Absolute: 0.5 10*3/uL (ref 0.1–1.0)
Monocytes Relative: 6 %
Neutro Abs: 6.9 10*3/uL (ref 1.7–7.7)
Neutrophils Relative %: 80 %
PLATELETS: 213 10*3/uL (ref 150–400)
RBC: 4.23 MIL/uL (ref 3.87–5.11)
RDW: 14.2 % (ref 11.5–15.5)
WBC: 8.6 10*3/uL (ref 4.0–10.5)

## 2015-04-10 MED ORDER — DIPHENHYDRAMINE HCL 25 MG PO CAPS
25.0000 mg | ORAL_CAPSULE | Freq: Once | ORAL | Status: AC
  Administered 2015-04-10: 25 mg via ORAL
  Filled 2015-04-10: qty 1

## 2015-04-10 MED ORDER — DIPHENHYDRAMINE HCL 25 MG PO TABS: 25.0000 mg | ORAL_TABLET | Freq: Three times a day (TID) | ORAL | Status: DC

## 2015-04-10 MED ORDER — PREDNISONE 20 MG PO TABS
60.0000 mg | ORAL_TABLET | ORAL | Status: AC
  Administered 2015-04-10: 60 mg via ORAL
  Filled 2015-04-10: qty 3

## 2015-04-10 MED ORDER — FAMOTIDINE 20 MG PO TABS: 20.0000 mg | ORAL_TABLET | Freq: Two times a day (BID) | ORAL | Status: DC

## 2015-04-10 MED ORDER — EPINEPHRINE 0.3 MG/0.3ML IJ SOAJ: 0.3000 mg | Freq: Once | INTRAMUSCULAR | Status: AC

## 2015-04-10 MED ORDER — FAMOTIDINE 20 MG PO TABS
20.0000 mg | ORAL_TABLET | Freq: Once | ORAL | Status: AC
  Administered 2015-04-10: 20 mg via ORAL
  Filled 2015-04-10: qty 1

## 2015-04-10 MED ORDER — PREDNISONE 20 MG PO TABS: 40.0000 mg | ORAL_TABLET | Freq: Every day | ORAL | Status: AC

## 2015-04-10 NOTE — Discharge Instructions (Signed)
As discussed, your evaluation today has been largely reassuring.  But, it is important that you monitor your condition carefully, and do not hesitate to return to the ED if you develop new, or concerning changes in your condition.  Please take all medication as directed.  You may use the provided prescriptions, or purchase over the counter equivalents.  Otherwise, please follow-up with your physician for appropriate ongoing care.

## 2015-04-10 NOTE — ED Notes (Signed)
MD at bedside. 

## 2015-04-10 NOTE — ED Notes (Signed)
Per pt, states she was bit by brown recluse spider in August-started new round of antibiotics in September for 2 weeks-broke out in rash on Sunday-was seen in [place] and meds were stopped-saw PCP today and stated her lymph nodes were swollen and needed to come to ED-states difficulty swallowing

## 2015-04-10 NOTE — ED Provider Notes (Signed)
CSN: [id number]     Arrival date & time 04/10/15  1030 History   First MD Initiated Contact with Patient 04/10/15 1049     Chief Complaint  Patient presents with  . Rash    HPI  Patient presents with concern of rash. Rash began 3 days ago, since onset has spread throughout her habitus, with no mucosal involvement, no glabrous skin involvement. She feels tightness in her throat, minimal dyspnea.  No syncope / CP / f / C. Patient was seen for the rash 48 hours ago, stayed in the emergency room at another facility overnight, was discharged 24 hours ago. She states that she continues to have generalized discomfort from the rash, as well as mild discomfort at the base of her neck. Note, the patient was recently treated for spider bite, and has been taking dapsone, clindamycin, for the past 2 weeks. Yesterday the physicians have patient stop the medication. Today, the patient followed up with her podiatrist, and was referred here for concerns of anaphylaxis.  Past Medical History  Diagnosis Date  . Asthma   . Migraine   . TB lung, latent   . Cancer [facility])     right side    Past Surgical History  Procedure Laterality Date  . Rt breast mass    . Laparoscopic endometriosis fulguration     Family History  Problem Relation Age of Onset  . Cervical cancer Maternal Grandmother   . Heart disease Maternal Grandmother   . Cancer Mother     breast   . Depression Brother   . Bipolar disorder Brother   . Schizophrenia Brother    Social History  Substance Use Topics  . Smoking status: Current Some Day Smoker  . Smokeless tobacco: Never Used  . Alcohol Use: No   OB History    No data available     Review of Systems  Skin: Positive for rash.  Allergic/Immunologic: Negative for immunocompromised state.       Breast CA 2008, last monitoring eval 07/2014      Allergies  Bee venom; Penicillins; and Latex  Home Medications   Prior to Admission medications   Medication Sig Start  Date End Date Taking? Authorizing Provider  cetirizine (ZYRTEC) 10 MG tablet Take 10 mg by mouth daily.    Historical Provider, MD  cyclobenzaprine (FLEXERIL) 5 MG tablet Take 1 tablet (5 mg total) by mouth every 8 (eight) hours as needed for muscle spasms (or pain. Do not drive or operate machinery while taking as can cause drowsiness.). Patient not taking: Reported on 02/27/2015 11/21/14   Marylene Land, NP  doxycycline (VIBRAMYCIN) 100 MG capsule Take 1 capsule (100 mg total) by mouth 2 (two) times daily. 02/27/15   Lily Kocher, PA-C  EPINEPHrine (EPIPEN 2-PAK) 0.3 mg/0.3 mL IJ SOAJ injection Inject 0.3 mg into the muscle once.    Historical Provider, MD  fluticasone (FLONASE) 50 MCG/ACT nasal spray Place 1 spray into both nostrils daily.    Historical Provider, MD  HYDROcodone-acetaminophen (NORCO/VICODIN) 5-325 MG per tablet Take 1 tablet by mouth every 4 (four) hours as needed. 02/27/15   Lily Kocher, PA-C   BP 121/69 mmHg  Pulse 112  Temp(Src) 98.3 F (36.8 C) (Oral)  Resp 16  SpO2 95%  LMP 03/27/2015 Physical Exam  Constitutional: She is oriented to person, place, and time. She appears well-developed and well-nourished. No distress.  HENT:  Head: Normocephalic and atraumatic.  Mouth/Throat: Uvula is midline, oropharynx is clear and moist and  mucous membranes are normal.  Eyes: Conjunctivae and EOM are normal.  Cardiovascular: Regular rhythm.  Tachycardia present.   Pulmonary/Chest: Effort normal and breath sounds normal. No stridor. No respiratory distress.  Abdominal: She exhibits no distension.  Musculoskeletal: She exhibits no edema.  Lymphadenopathy:    She has cervical adenopathy.       Right cervical: Superficial cervical adenopathy present.       Left cervical: Superficial cervical adenopathy present.  Minimally appreciable bilateral cervical adenopathy  Neurological: She is alert and oriented to person, place, and time. No cranial nerve deficit.  Skin: Skin is warm  and dry.  Diffuse, widely scattered minimally raised papular, erythematous lesions  Psychiatric: Her mood appears anxious.  Nursing note and vitals reviewed.   ED Course  Procedures (including critical care time) Labs Review Labs Reviewed  BASIC METABOLIC PANEL - Abnormal; Notable for the following:    Potassium 3.4 (*)    All other components within normal limits  CBC WITH DIFFERENTIAL/PLATELET     Pulse oximetry 100% room air normal Cardiac 111 sinus tach abnormal   2:11 PM After-hours monitoring, the rash has resolved in the face, is reduced in the extremities. We discussed all findings at length, the need for medication compliance as an outpatient, close follow-up, return precautions.  MDM  Patient presents with new diffuse rash, throat discomfort. Patient has a notable recent history of treatment for spider bite, as well as prolonged course of antibiotics. No evidence for anaphylaxis today, as the patient has a patent airway, no hypoxia, tachypnea, clear oropharynx. Patient does have skin changes most consistent with drug reaction, with low suspicion for SJS, TEN, or other life threatening cutaneous lesions. With improvement following steroids, Benadryl, Pepcid and no new complaints, the patient was discharged in stable condition to follow-up with her primary care physician.   [name], MD 04/10/15 [phone]

## 2015-05-19 ENCOUNTER — Encounter (HOSPITAL_COMMUNITY): Payer: Self-pay | Admitting: *Deleted

## 2015-05-19 ENCOUNTER — Emergency Department (HOSPITAL_COMMUNITY)
Admission: EM | Admit: 2015-05-19 | Discharge: 2015-05-20 | Disposition: A | Discharge status: Home / Self Care | Payer: Commercial Managed Care - PPO | Attending: Emergency Medicine | Admitting: Emergency Medicine

## 2015-05-19 ENCOUNTER — Emergency Department (HOSPITAL_COMMUNITY): Payer: Commercial Managed Care - PPO

## 2015-05-19 DIAGNOSIS — Z91040 Latex allergy status: Secondary | ICD-10-CM | POA: Diagnosis not present

## 2015-05-19 DIAGNOSIS — R05 Cough: Secondary | ICD-10-CM | POA: Diagnosis not present

## 2015-05-19 DIAGNOSIS — R112 Nausea with vomiting, unspecified: Secondary | ICD-10-CM | POA: Diagnosis not present

## 2015-05-19 DIAGNOSIS — Z880 Allergy status to penicillin: Secondary | ICD-10-CM | POA: Diagnosis not present

## 2015-05-19 DIAGNOSIS — R110 Nausea: Secondary | ICD-10-CM

## 2015-05-19 DIAGNOSIS — J45909 Unspecified asthma, uncomplicated: Secondary | ICD-10-CM | POA: Insufficient documentation

## 2015-05-19 DIAGNOSIS — Z87891 Personal history of nicotine dependence: Secondary | ICD-10-CM | POA: Diagnosis not present

## 2015-05-19 DIAGNOSIS — R1032 Left lower quadrant pain: Secondary | ICD-10-CM | POA: Insufficient documentation

## 2015-05-19 DIAGNOSIS — R109 Unspecified abdominal pain: Secondary | ICD-10-CM

## 2015-05-19 DIAGNOSIS — Z853 Personal history of malignant neoplasm of breast: Secondary | ICD-10-CM | POA: Diagnosis not present

## 2015-05-19 DIAGNOSIS — Z8679 Personal history of other diseases of the circulatory system: Secondary | ICD-10-CM | POA: Diagnosis not present

## 2015-05-19 DIAGNOSIS — M545 Low back pain: Secondary | ICD-10-CM | POA: Insufficient documentation

## 2015-05-19 DIAGNOSIS — Z3202 Encounter for pregnancy test, result negative: Secondary | ICD-10-CM | POA: Diagnosis not present

## 2015-05-19 DIAGNOSIS — Z8611 Personal history of tuberculosis: Secondary | ICD-10-CM | POA: Insufficient documentation

## 2015-05-19 LAB — URINALYSIS, ROUTINE W REFLEX MICROSCOPIC
BILIRUBIN URINE: NEGATIVE
Glucose, UA: NEGATIVE mg/dL
KETONES UR: NEGATIVE mg/dL
NITRITE: NEGATIVE
Protein, ur: NEGATIVE mg/dL
Specific Gravity, Urine: <1.005 — ABNORMAL LOW (ref 1.005–1.030)
UROBILINOGEN UA: 0.2 mg/dL (ref 0.0–1.0)
pH: 6 (ref 5.0–8.0)

## 2015-05-19 LAB — PREGNANCY, URINE: PREG TEST UR: NEGATIVE

## 2015-05-19 LAB — URINE MICROSCOPIC-ADD ON

## 2015-05-19 MED ORDER — ONDANSETRON HCL 4 MG/2ML IJ SOLN
4.0000 mg | Freq: Four times a day (QID) | INTRAMUSCULAR | Status: DC | PRN
Start: 2015-05-19 — End: 2015-05-20
  Administered 2015-05-20: 4 mg via INTRAVENOUS
  Filled 2015-05-19: qty 2

## 2015-05-19 MED ORDER — MORPHINE SULFATE (PF) 4 MG/ML IV SOLN
4.0000 mg | Freq: Once | INTRAVENOUS | Status: AC
  Administered 2015-05-20: 4 mg via INTRAVENOUS
  Filled 2015-05-19: qty 1

## 2015-05-19 MED ORDER — SODIUM CHLORIDE 0.9 % IV BOLUS (SEPSIS)
500.0000 mL | Freq: Once | INTRAVENOUS | Status: AC
  Administered 2015-05-20: 500 mL via INTRAVENOUS

## 2015-05-19 NOTE — ED Provider Notes (Signed)
CSN: [id number]     Arrival date & time 05/19/15  2202 History  By signing my name below, I, Tammy Johnson, attest that this documentation has been prepared under the direction and in the presence of [name], MD. Electronically Signed: Hansel Johnson, ED Scribe. 05/19/2015. 11:43 PM.    Chief Complaint  Patient presents with  . Abdominal Pain   The history is provided by the patient. No language interpreter was used.    HPI Comments: Tammy Johnson is a 34 y.o. female who presents to the Emergency Department complaining of moderate, waxing and waning, left lower abdominal pain onset this morning. She states associated cough, lower back pain yesterday, nausea and vomiting. She notes that abdominal pain is worsened with coughing. She states she took ibuprofen PTA with mild relief. Pt states she missed her normal period last month (was supposed to be 22-27) and now has abnormal vaginal spotting since yesterday. Pt notes that she is in the process of scheduling a cholecystectomy. No h/o ovarian cysts. She notes h/o suprapubic pain during her menstrual cycle. She denies hematuria, dysuria, hematuria, recent abnormal bowel movements, recent weight loss.  Past Medical History  Diagnosis Date  . Asthma   . Migraine   . TB lung, latent   . Cancer [facility])     breast right side    Past Surgical History  Procedure Laterality Date  . Rt breast mass    . Laparoscopic endometriosis fulguration    . Breast surgery     Family History  Problem Relation Age of Onset  . Cervical cancer Maternal Grandmother   . Heart disease Maternal Grandmother   . Cancer Mother     breast   . Depression Brother   . Bipolar disorder Brother   . Schizophrenia Brother    Social History  Substance Use Topics  . Smoking status: Former Smoker    Quit date: 03/18/2014  . Smokeless tobacco: Never Used  . Alcohol Use: No   OB History    No data available     Review of Systems A complete 10 system review of  systems was obtained and all systems are negative except as noted in the HPI and PMH.    Allergies  Bee venom; Penicillins; Dapsone; Clindamycin/lincomycin; and Latex  Home Medications   Prior to Admission medications   Medication Sig Start Date End Date Taking? Authorizing Provider  EPINEPHrine 0.3 mg/0.3 mL IJ SOAJ injection Inject 0.3 mLs (0.3 mg total) into the muscle once. 04/10/15  Yes [name], MD  ibuprofen (ADVIL,MOTRIN) 200 MG tablet Take 400 mg by mouth every 6 (six) hours as needed for fever or moderate pain.   Yes Historical Provider, MD  diphenhydrAMINE (BENADRYL) 25 MG tablet Take 1 tablet (25 mg total) by mouth 3 (three) times daily. Patient not taking: Reported on 05/19/2015 04/10/15 04/12/15  [name], MD  famotidine (PEPCID) 20 MG tablet Take 1 tablet (20 mg total) by mouth 2 (two) times daily. Patient not taking: Reported on 05/19/2015 04/10/15 04/12/15  [name], MD   Pulse 83  Temp(Src) 98.2 F (36.8 C) (Oral)  Resp 20  Ht 5\' 6"  (1.676 m)  Wt 135 lb (61.236 kg)  BMI 21.80 kg/m2  SpO2 100%  LMP 03/30/2015 Physical Exam  Constitutional: She is oriented to person, place, and time. She appears well-developed and well-nourished. No distress.  HENT:  Head: Normocephalic and atraumatic.  Eyes: EOM are normal.  Neck: Normal range of motion.  Cardiovascular: Normal  rate, regular rhythm and normal heart sounds.   Pulmonary/Chest: Effort normal and breath sounds normal.  Abdominal: Soft. She exhibits no distension. There is tenderness.  Mild LLQ tenderness.   Musculoskeletal: Normal range of motion.  Neurological: She is alert and oriented to person, place, and time.  Skin: Skin is warm and dry.  Psychiatric: She has a normal mood and affect. Judgment normal.  Nursing note and vitals reviewed.  ED Course  Procedures (including critical care time) DIAGNOSTIC STUDIES: Oxygen Saturation is 100% on RA, normal by my interpretation.    COORDINATION  OF CARE: 11:43 PM Discussed treatment plan with pt at bedside and pt agreed to plan.   Labs Review Labs Reviewed  URINALYSIS, ROUTINE W REFLEX MICROSCOPIC (NOT AT [facility]) - Abnormal; Notable for the following:    Color, Urine STRAW (*)    Specific Gravity, Urine <1.005 (*)    Hgb urine dipstick MODERATE (*)    Leukocytes, UA TRACE (*)    All other components within normal limits  URINE MICROSCOPIC-ADD ON - Abnormal; Notable for the following:    Squamous Epithelial / LPF MANY (*)    Bacteria, UA FEW (*)    All other components within normal limits  PREGNANCY, URINE  CBC WITH DIFFERENTIAL/PLATELET  COMPREHENSIVE METABOLIC PANEL  LIPASE, BLOOD    Imaging Review Ct Abdomen Pelvis Wo Contrast  05/20/2015  CLINICAL DATA:  Left lower abdominal pain onset this morning. EXAM: CT ABDOMEN AND PELVIS WITHOUT CONTRAST TECHNIQUE: Multidetector CT imaging of the abdomen and pelvis was performed following the standard protocol without IV contrast. COMPARISON:  11/04/2013 FINDINGS: Lower chest and abdominal wall:  Small fatty umbilical hernia Hepatobiliary: No focal liver abnormality.No evidence of biliary obstruction or stone. Pancreas: Unremarkable. Spleen: Unremarkable. Adrenals/Urinary Tract: Negative adrenals. No hydronephrosis or stone. Unremarkable bladder. Reproductive:No pathologic findings.  Retroverted uterus. Stomach/Bowel: No obstruction. No appendicitis. Rare colonic diverticula. No active inflammation. Vascular/Lymphatic: No acute vascular abnormality. No mass or adenopathy. Peritoneal: No ascites or pneumoperitoneum. Musculoskeletal: No acute abnormalities. IMPRESSION: Negative.  No explanation for abdominal pain. Electronically Signed   By: [name] M.D.   On: 05/20/2015 00:52   I have personally reviewed and evaluated these images and lab results as part of my medical decision-making.  MDM   Final diagnoses:  Abdominal pain, unspecified abdominal location  Nausea    1:30  AM Patient feeling better at this time.  Vital signs remain stable.  CT scan without acute pathology.  This could represent left-sided ovarian cyst.  Doubt ovarian torsion.  No other obvious other modalities.  May represent a recently passed left ureteral stone.  Discharge home in good condition.  Primary care follow-up.  She understands return the ER for new or worsening symptoms.   I personally performed the services described in this documentation, which was scribed in my presence. The recorded information has been reviewed and is accurate.        [name], MD 05/20/15 [phone]

## 2015-05-19 NOTE — ED Notes (Signed)
Pt states she missed her period in Oct. (suspossed to be around the 22nd-27th) pt states she started spotting yesterday. Pt c/o lower left abdominal pain/cramping. Pt also c/o n/v.

## 2015-05-20 LAB — CBC WITH DIFFERENTIAL/PLATELET
BASOS PCT: 1 %
Basophils Absolute: 0.1 10*3/uL (ref 0.0–0.1)
EOS ABS: 0.3 10*3/uL (ref 0.0–0.7)
Eosinophils Relative: 3 %
HEMATOCRIT: 38.9 % (ref 36.0–46.0)
Hemoglobin: 13.1 g/dL (ref 12.0–15.0)
Lymphocytes Relative: 26 %
Lymphs Abs: 2.8 10*3/uL (ref 0.7–4.0)
MCH: 30 pg (ref 26.0–34.0)
MCHC: 33.7 g/dL (ref 30.0–36.0)
MCV: 89 fL (ref 78.0–100.0)
MONO ABS: 0.7 10*3/uL (ref 0.1–1.0)
Monocytes Relative: 7 %
Neutro Abs: 6.6 10*3/uL (ref 1.7–7.7)
Neutrophils Relative %: 63 %
Platelets: 292 10*3/uL (ref 150–400)
RBC: 4.37 MIL/uL (ref 3.87–5.11)
RDW: 13.8 % (ref 11.5–15.5)
WBC: 10.5 10*3/uL (ref 4.0–10.5)

## 2015-05-20 LAB — COMPREHENSIVE METABOLIC PANEL
ALK PHOS: 76 U/L (ref 38–126)
ALT: 13 U/L — AB (ref 14–54)
AST: 15 U/L (ref 15–41)
Albumin: 4 g/dL (ref 3.5–5.0)
Anion gap: 7 (ref 5–15)
BUN: 11 mg/dL (ref 6–20)
CALCIUM: 9 mg/dL (ref 8.9–10.3)
CO2: 25 mmol/L (ref 22–32)
Chloride: 106 mmol/L (ref 101–111)
Creatinine, Ser: 0.75 mg/dL (ref 0.44–1.00)
GFR calc Af Amer: >60 mL/min (ref >60)
Glucose, Bld: 96 mg/dL (ref 65–99)
Potassium: 3.7 mmol/L (ref 3.5–5.1)
Sodium: 138 mmol/L (ref 135–145)
Total Bilirubin: 0.3 mg/dL (ref 0.3–1.2)
Total Protein: 6.7 g/dL (ref 6.5–8.1)

## 2015-05-20 LAB — LIPASE, BLOOD: Lipase: 29 U/L (ref 11–51)

## 2015-05-20 MED ORDER — IBUPROFEN 600 MG PO TABS: 600.0000 mg | ORAL_TABLET | Freq: Three times a day (TID) | ORAL | Status: DC | PRN

## 2015-05-20 MED ORDER — ONDANSETRON 8 MG PO TBDP: 8.0000 mg | ORAL_TABLET | Freq: Three times a day (TID) | ORAL | Status: DC | PRN

## 2015-05-20 MED ORDER — HYDROCODONE-ACETAMINOPHEN 5-325 MG PO TABS: 1.0000 | ORAL_TABLET | Freq: Four times a day (QID) | ORAL | Status: DC | PRN

## 2015-05-20 NOTE — Discharge Instructions (Signed)

## 2015-05-20 NOTE — ED Notes (Signed)
Pt alert & oriented x4, stable gait. Patient given discharge instructions, paperwork & prescription(s). Patient  instructed to stop at the registration desk to finish any additional paperwork. Patient verbalized understanding. Pt left department w/ no further questions. 

## 2015-05-22 ENCOUNTER — Encounter: Payer: Self-pay | Admitting: Obstetrics & Gynecology

## 2015-05-22 ENCOUNTER — Ambulatory Visit (INDEPENDENT_AMBULATORY_CARE_PROVIDER_SITE_OTHER): Payer: Commercial Managed Care - PPO | Admitting: Obstetrics & Gynecology

## 2015-05-22 VITALS — BP 90/60 | HR 82 | Wt 132.0 lb

## 2015-05-22 DIAGNOSIS — R1032 Left lower quadrant pain: Secondary | ICD-10-CM | POA: Diagnosis not present

## 2015-05-22 NOTE — Progress Notes (Signed)
Patient ID: Tammy Johnson, female   DOB: 09-08-1980, 34 y.o.   MRN: [id number] Follow up appointment for results  Chief Complaint  Patient presents with  . Follow-up    [facility]- abdominal pain    Blood pressure 90/60, pulse 82, weight 132 lb (59.875 kg), last menstrual period 03/30/2015.   CLINICAL DATA: Left lower abdominal pain onset this morning.  EXAM: CT ABDOMEN AND PELVIS WITHOUT CONTRAST  TECHNIQUE: Multidetector CT imaging of the abdomen and pelvis was performed following the standard protocol without IV contrast.  COMPARISON: 11/04/2013  FINDINGS: Lower chest and abdominal wall: Small fatty umbilical hernia  Hepatobiliary: No focal liver abnormality.No evidence of biliary obstruction or stone.  Pancreas: Unremarkable.  Spleen: Unremarkable.  Adrenals/Urinary Tract: Negative adrenals. No hydronephrosis or stone. Unremarkable bladder.  Reproductive:No pathologic findings. Retroverted uterus.  Stomach/Bowel: No obstruction. No appendicitis. Rare colonic diverticula. No active inflammation.  Vascular/Lymphatic: No acute vascular abnormality. No mass or adenopathy.  Peritoneal: No ascites or pneumoperitoneum.  Musculoskeletal: No acute abnormalities.  IMPRESSION: Negative. No explanation for abdominal pain.   Electronically Signed  By: [name] M.D.  On: 05/20/2015 00:52  MEDS ordered this encounter: No orders of the defined types were placed in this encounter.    Orders for this encounter: No orders of the defined types were placed in this encounter.   Trigger Point Injection   Pre-operative diagnosis: myofascial pain  Post-operative diagnosis: myofascial pain  After risks and benefits were explained including bleeding, infection, worsening of the pain, damage to the area being injected, weakness, allergic reaction to medications, vascular injection, and nerve damage, signed consent was obtained.  All questions were  answered.    The area of the trigger point was identified and the skin prepped three times with alcohol and the alcohol allowed to dry.  Next, a 25 gauge 0.5 inch needle was placed in the area of the trigger point.  Once reproduction of the pain was elicited and negative aspiration confirmed, the trigger point was injected and the needle removed.    The patient did tolerate the procedure well and there were not complications.    Medication used:  0.5% marcine   Trigger points injected: 1    Trigger point(s) location(s):  left insertion of rectus abdominaus on the pubis Plan: salonpas 2% lidocaine patches  Follow Up:  2 weeks      Greater than 50% of the visit time was spent in counseling and coordination of care with the patient.  The summary and outline of the counseling and care coordination is summarized in the note above.   All questions were answered.  Past Medical History  Diagnosis Date  . Asthma   . Migraine   . TB lung, latent   . Cancer [facility])     breast right side     Past Surgical History  Procedure Laterality Date  . Rt breast mass    . Laparoscopic endometriosis fulguration    . Breast surgery      OB History    No data available        Social History   Social History  . Marital Status: Married    Spouse Name: N/A  . Number of Children: N/A  . Years of Education: N/A   Social History Main Topics  . Smoking status: Former Smoker    Quit date: 03/18/2014  . Smokeless tobacco: Never Used  . Alcohol Use: No  . Drug Use: No  . Sexual Activity: Yes  Birth Control/ Protection: None   Other Topics Concern  . None   Social History Narrative    Family History  Problem Relation Age of Onset  . Cervical cancer Maternal Grandmother   . Heart disease Maternal Grandmother   . Cancer - Colon Maternal Grandmother   . Cancer Mother     breast   . Depression Brother   . Bipolar disorder Brother   . Schizophrenia Brother

## 2015-06-05 ENCOUNTER — Ambulatory Visit (INDEPENDENT_AMBULATORY_CARE_PROVIDER_SITE_OTHER): Payer: Commercial Managed Care - PPO | Admitting: Obstetrics & Gynecology

## 2015-06-05 ENCOUNTER — Encounter: Payer: Self-pay | Admitting: Obstetrics & Gynecology

## 2015-06-05 VITALS — BP 90/60 | HR 72 | Wt 133.3 lb

## 2015-06-05 DIAGNOSIS — R1032 Left lower quadrant pain: Secondary | ICD-10-CM | POA: Diagnosis not present

## 2015-06-05 DIAGNOSIS — N912 Amenorrhea, unspecified: Secondary | ICD-10-CM | POA: Diagnosis not present

## 2015-06-05 DIAGNOSIS — Z3202 Encounter for pregnancy test, result negative: Secondary | ICD-10-CM

## 2015-06-05 LAB — POCT URINE PREGNANCY: Preg Test, Ur: NEGATIVE

## 2015-06-05 MED ORDER — MEDROXYPROGESTERONE ACETATE 10 MG PO TABS: 10.0000 mg | ORAL_TABLET | Freq: Every day | ORAL | Status: DC

## 2015-06-05 NOTE — Progress Notes (Signed)
Patient ID: Tammy Johnson, female   DOB: 09/01/1980, 34 y.o.   MRN: [id number]      Chief Complaint  Patient presents with  . Follow-up    last peroid 03-30-15.    Blood pressure 90/60, pulse 72, weight 133 lb 4.8 oz (60.464 kg), last menstrual period 03/30/2015.  34 y.o. No obstetric history on file. Patient's last menstrual period was 03/30/2015.   Subjective In for recheck of her LLQ pain which is significantly better Discussed injection today and she feels it is good enough not to have injected Will get and use lio patches  Objective Much improved myofascial pain in the LLQ/rectus pubis interface  Pertinent ROS   Labs or studies     Impression Diagnoses this Encounter::   ICD-9-CM ICD-10-CM   1. Amenorrhea 626.0 N91.2   2. Negative pregnancy test V72.41 Z32.02 POCT urine pregnancy  3. LLQ pain 789.04 R10.32     Established relevant diagnosis(es):   Plan/Recommendations: Meds ordered this encounter  Medications  . medroxyPROGESTERone (PROVERA) 10 MG tablet    Sig: Take 1 tablet (10 mg total) by mouth daily.    Dispense:  10 tablet    Refill:  11    Labs or Scans Ordered: Orders Placed This Encounter  Procedures  . POCT urine pregnancy      Follow up       Face to face time:  15 minutes  Greater than 50% of the visit time was spent in counseling and coordination of care with the patient.  The summary and outline of the counseling and care coordination is summarized in the note above.   All questions were answered.

## 2015-06-23 ENCOUNTER — Emergency Department: Payer: Commercial Managed Care - PPO

## 2015-06-23 ENCOUNTER — Encounter: Payer: Self-pay | Admitting: Emergency Medicine

## 2015-06-23 ENCOUNTER — Emergency Department
Admission: EM | Admit: 2015-06-23 | Discharge: 2015-06-24 | Disposition: A | Discharge status: Home / Self Care | Payer: Commercial Managed Care - PPO | Attending: Emergency Medicine | Admitting: Emergency Medicine

## 2015-06-23 DIAGNOSIS — R197 Diarrhea, unspecified: Secondary | ICD-10-CM

## 2015-06-23 DIAGNOSIS — Z87891 Personal history of nicotine dependence: Secondary | ICD-10-CM | POA: Insufficient documentation

## 2015-06-23 DIAGNOSIS — Z3202 Encounter for pregnancy test, result negative: Secondary | ICD-10-CM | POA: Diagnosis not present

## 2015-06-23 DIAGNOSIS — K529 Noninfective gastroenteritis and colitis, unspecified: Secondary | ICD-10-CM

## 2015-06-23 DIAGNOSIS — Z880 Allergy status to penicillin: Secondary | ICD-10-CM | POA: Insufficient documentation

## 2015-06-23 DIAGNOSIS — Z792 Long term (current) use of antibiotics: Secondary | ICD-10-CM | POA: Diagnosis not present

## 2015-06-23 DIAGNOSIS — R1110 Vomiting, unspecified: Secondary | ICD-10-CM

## 2015-06-23 DIAGNOSIS — Z91040 Latex allergy status: Secondary | ICD-10-CM | POA: Diagnosis not present

## 2015-06-23 DIAGNOSIS — Z79899 Other long term (current) drug therapy: Secondary | ICD-10-CM | POA: Insufficient documentation

## 2015-06-23 DIAGNOSIS — R1011 Right upper quadrant pain: Secondary | ICD-10-CM | POA: Diagnosis present

## 2015-06-23 DIAGNOSIS — R109 Unspecified abdominal pain: Secondary | ICD-10-CM

## 2015-06-23 LAB — CBC
HCT: 42.5 % (ref 35.0–47.0)
Hemoglobin: 14.3 g/dL (ref 12.0–16.0)
MCH: 29.7 pg (ref 26.0–34.0)
MCHC: 33.6 g/dL (ref 32.0–36.0)
MCV: 88.4 fL (ref 80.0–100.0)
PLATELETS: 241 10*3/uL (ref 150–440)
RBC: 4.81 MIL/uL (ref 3.80–5.20)
RDW: 13.2 % (ref 11.5–14.5)
WBC: 6.6 10*3/uL (ref 3.6–11.0)

## 2015-06-23 LAB — COMPREHENSIVE METABOLIC PANEL
ALT: 43 U/L (ref 14–54)
ANION GAP: 5 (ref 5–15)
AST: 24 U/L (ref 15–41)
Albumin: 4 g/dL (ref 3.5–5.0)
Alkaline Phosphatase: 71 U/L (ref 38–126)
BUN: 10 mg/dL (ref 6–20)
CALCIUM: 8.8 mg/dL — AB (ref 8.9–10.3)
CHLORIDE: 102 mmol/L (ref 101–111)
CO2: 29 mmol/L (ref 22–32)
CREATININE: 0.9 mg/dL (ref 0.44–1.00)
GFR calc non Af Amer: >60 mL/min (ref >60)
Glucose, Bld: 90 mg/dL (ref 65–99)
Potassium: 3.3 mmol/L — ABNORMAL LOW (ref 3.5–5.1)
SODIUM: 136 mmol/L (ref 135–145)
Total Bilirubin: 0.7 mg/dL (ref 0.3–1.2)
Total Protein: 7 g/dL (ref 6.5–8.1)

## 2015-06-23 LAB — URINALYSIS COMPLETE WITH MICROSCOPIC (ARMC ONLY)
BILIRUBIN URINE: NEGATIVE
GLUCOSE, UA: NEGATIVE mg/dL
Hgb urine dipstick: NEGATIVE
LEUKOCYTES UA: NEGATIVE
Nitrite: NEGATIVE
Protein, ur: 30 mg/dL — AB
Specific Gravity, Urine: 1.025 (ref 1.005–1.030)
pH: 6 (ref 5.0–8.0)

## 2015-06-23 LAB — POCT PREGNANCY, URINE: Preg Test, Ur: NEGATIVE

## 2015-06-23 LAB — LIPASE, BLOOD: LIPASE: 21 U/L (ref 11–51)

## 2015-06-23 MED ORDER — SODIUM CHLORIDE 0.9 % IV BOLUS (SEPSIS)
1000.0000 mL | Freq: Once | INTRAVENOUS | Status: AC
  Administered 2015-06-23: 1000 mL via INTRAVENOUS

## 2015-06-23 MED ORDER — ONDANSETRON HCL 4 MG/2ML IJ SOLN
4.0000 mg | Freq: Once | INTRAMUSCULAR | Status: AC
  Administered 2015-06-23: 4 mg via INTRAVENOUS
  Filled 2015-06-23: qty 2

## 2015-06-23 MED ORDER — MORPHINE SULFATE (PF) 4 MG/ML IV SOLN
4.0000 mg | Freq: Once | INTRAVENOUS | Status: AC
  Administered 2015-06-23: 4 mg via INTRAVENOUS
  Filled 2015-06-23: qty 1

## 2015-06-23 NOTE — ED Notes (Signed)
Pt. States vomiting and diarrhea for the past week.  Pt. States upper right abdominal pain for the past week.  Pt. States difficulty in keeping food and drink down.  Pt. States family has had "stomach bug in the past two weeks"

## 2015-06-23 NOTE — ED Notes (Signed)
Patient transported to Ultrasound 

## 2015-06-23 NOTE — ED Provider Notes (Signed)
[facility] Emergency Department Provider Note  ____________________________________________  Time seen: Approximately 11:18 PM  I have reviewed the triage vital signs and the nursing notes.   HISTORY  Chief Complaint Abdominal Pain    HPI Tammy Johnson is a 34 y.o. female who comes into the hospital today with vomiting and diarrhea. The patient reports that she has been unable to keep food down for the past 7 days. She initially thought it was a virus because her children had vomiting and diarrhea prior to her symptoms. The patient reports though that every time she eats she had a vomiting or has diarrhea as well as some epigastric or right upper quadrant pain. The patient reports that the pain also refers into her shoulder occasionally. She is concerned that this may be due to her gallbladder given her symptoms. She is also concerned about dehydration. The patient reports that she tried to get in touch with her doctor but they could not see her until next week and she did not want to wait until then. The patient's pain is a 6 out of 10 in intensity and she has not been able to keep anything down. She even vomited up ODT Zofran which she has at home. She describes her emesis is greenish yellow and her diarrhea as clay colored and watery. The patient comes into the emergency department for evaluation.   Past Medical History  Diagnosis Date  . Asthma   . Migraine   . TB lung, latent   . 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 Migraines 11/13/2013    Past Surgical History  Procedure Laterality Date  . Rt breast mass    . Laparoscopic endometriosis fulguration    . Breast surgery      Current Outpatient Rx  Name  Route  Sig  Dispense  Refill  . ciprofloxacin (CIPRO) 500 MG tablet   Oral   Take 1 tablet (500 mg total) by mouth 2 (two) times daily.   10 tablet   0   . EXPIRED: diphenhydrAMINE  (BENADRYL) 25 MG tablet   Oral   Take 1 tablet (25 mg total) by mouth 3 (three) times daily. Patient not taking: Reported on 05/19/2015   7 tablet   0   . EPINEPHrine 0.3 mg/0.3 mL IJ SOAJ injection   Intramuscular   Inject 0.3 mLs (0.3 mg total) into the muscle once.   1 Device   1   . EXPIRED: famotidine (PEPCID) 20 MG tablet   Oral   Take 1 tablet (20 mg total) by mouth 2 (two) times daily. Patient not taking: Reported on 05/19/2015   5 tablet   0   . ibuprofen (ADVIL,MOTRIN) 600 MG tablet   Oral   Take 1 tablet (600 mg total) by mouth every 8 (eight) hours as needed.   15 tablet   0   . medroxyPROGESTERone (PROVERA) 10 MG tablet   Oral   Take 1 tablet (10 mg total) by mouth daily.   10 tablet   11   . promethazine (PHENERGAN) 25 MG suppository   Rectal   Place 1 suppository (25 mg total) rectally every 6 (six) hours as needed for nausea.   12 suppository   0     Allergies Bee venom; Penicillins; Dapsone; Clindamycin/lincomycin; and Latex  Family History  Problem Relation Age of Onset  . Cervical cancer Maternal  Grandmother   . Heart disease Maternal Grandmother   . Cancer - Colon Maternal Grandmother   . Cancer Mother     breast   . Depression Brother   . Bipolar disorder Brother   . Schizophrenia Brother     Social History Social History  Substance Use Topics  . Smoking status: Former Smoker    Quit date: 03/18/2014  . Smokeless tobacco: Never Used  . Alcohol Use: No    Review of Systems Constitutional: No fever/chills Eyes: No visual changes. ENT: No sore throat. Cardiovascular: Denies chest pain. Respiratory: Denies shortness of breath. Gastrointestinal:  abdominal pain with nausea, vomiting and diarrhea.   Genitourinary: Negative for dysuria. Musculoskeletal: Negative for back pain. Skin: Negative for rash. Neurological: Negative for headaches, focal weakness or numbness.  10-point ROS otherwise  negative.  ____________________________________________   PHYSICAL EXAM:  VITAL SIGNS: ED Triage Vitals  Enc Vitals Group     BP 06/23/15 1923 105/67 mmHg     Pulse Rate 06/23/15 1923 77     Resp 06/23/15 1923 16     Temp 06/23/15 1923 98.7 F (37.1 C)     Temp Source 06/23/15 1923 Oral     SpO2 06/23/15 1923 96 %     Weight 06/23/15 1923 127 lb 9.6 oz (57.879 kg)     Height 06/23/15 1923 5\' 6"  (1.676 m)     Head Cir --      Peak Flow --      Pain Score 06/23/15 1940 6     Pain Loc --      Pain Edu? --      Excl. in [place]? --     Constitutional: Alert and oriented. Well appearing and in moderate distress. Eyes: Conjunctivae are normal. PERRL. EOMI. Head: Atraumatic. Nose: No congestion/rhinnorhea. Mouth/Throat: Mucous membranes are moist.  Oropharynx non-erythematous. Cardiovascular: Normal rate, regular rhythm. Grossly normal heart sounds.  Good peripheral circulation. Respiratory: Normal respiratory effort.  No retractions. Lungs CTAB. Gastrointestinal: Soft with diffuse abdominal tenderness to palpation. No distention. Positive bowel sounds Musculoskeletal: No lower extremity tenderness nor edema.  Neurologic:  Normal speech and language.  Skin:  Skin is warm, dry and intact.  Psychiatric: Mood and affect are normal.   ____________________________________________   LABS (all labs ordered are listed, but only abnormal results are displayed)  Labs Reviewed  COMPREHENSIVE METABOLIC PANEL - Abnormal; Notable for the following:    Potassium 3.3 (*)    Calcium 8.8 (*)    All other components within normal limits  URINALYSIS COMPLETEWITH MICROSCOPIC ([facility] ONLY) - Abnormal; Notable for the following:    Color, Urine YELLOW (*)    APPearance HAZY (*)    Ketones, ur 2+ (*)    Protein, ur 30 (*)    Bacteria, UA RARE (*)    Squamous Epithelial / LPF 0-5 (*)    All other components within normal limits  LIPASE, BLOOD  CBC  POC URINE PREG, ED  POCT PREGNANCY, URINE    ____________________________________________  EKG  ED ECG REPORT I, Loney Hering, the attending physician, personally viewed and interpreted this ECG.   Date: 06/23/2015  EKG Time: 1933  Rate: 67  Rhythm: Normal sinus rhythm  Axis: Normal  Intervals:none  ST&T Change: None  ____________________________________________  RADIOLOGY  CT abdomen and pelvis: Fluid filled small and large bowel with mildly distended proximal jejunum and suggestion of jejunal wall thickening, changes may indicate enteritis and colitis.  Right upper quadrant ultrasound: Sludge and  small stones within physiologically distended gallbladder, no signs of acute cholecystitis. ____________________________________________   PROCEDURES  Procedure(s) performed: None  Critical Care performed: No  ____________________________________________   INITIAL IMPRESSION / ASSESSMENT AND PLAN / ED COURSE  Pertinent labs & imaging results that were available during my care of the patient were reviewed by me and considered in my medical decision making (see chart for details).  This is a 34 year old female who comes into the hospital today with abdominal pain vomiting and diarrhea. The patient reports that initially her pain was in the right upper quadrant and epigastric area but she is having tenderness throughout her entire abdomen. I will do an ultrasound and if it is negative I will then to a CT scan of the patient's abdomen. She received 2 L of normal saline and will receive some morphine and Zofran for her pain.  ----------------------------------------- 3:49 AM on 06/24/2015 -----------------------------------------  The patient was given some ciprofloxacin for her enteritis and colitis. She reports that after drinking her contrast she did feel some nausea so I gave her some Reglan. The patient will be discharged home to follow-up with her primary care physician with colitis nausea vomiting  diarrhea. ____________________________________________   FINAL CLINICAL IMPRESSION(S) / ED DIAGNOSES  Final diagnoses:  Abdominal pain  Enteritis  Colitis  Vomiting and diarrhea      Loney Hering, MD 06/24/15 [phone]

## 2015-06-24 ENCOUNTER — Emergency Department: Payer: Commercial Managed Care - PPO

## 2015-06-24 MED ORDER — IOHEXOL 240 MG/ML SOLN
25.0000 mL | Freq: Once | INTRAMUSCULAR | Status: AC | PRN
  Administered 2015-06-24: 25 mL via ORAL

## 2015-06-24 MED ORDER — METOCLOPRAMIDE HCL 5 MG/ML IJ SOLN
10.0000 mg | Freq: Once | INTRAMUSCULAR | Status: AC
  Administered 2015-06-24: 10 mg via INTRAVENOUS
  Filled 2015-06-24: qty 2

## 2015-06-24 MED ORDER — CIPROFLOXACIN HCL 500 MG PO TABS: 500.0000 mg | ORAL_TABLET | Freq: Two times a day (BID) | ORAL | Status: AC

## 2015-06-24 MED ORDER — PROMETHAZINE HCL 25 MG RE SUPP: 25.0000 mg | Freq: Four times a day (QID) | RECTAL | Status: DC | PRN

## 2015-06-24 MED ORDER — CIPROFLOXACIN IN D5W 400 MG/200ML IV SOLN
400.0000 mg | Freq: Once | INTRAVENOUS | Status: AC
  Administered 2015-06-24: 400 mg via INTRAVENOUS
  Filled 2015-06-24: qty 200

## 2015-06-24 MED ORDER — IOHEXOL 300 MG/ML  SOLN
100.0000 mL | Freq: Once | INTRAMUSCULAR | Status: AC | PRN
  Administered 2015-06-24: 100 mL via INTRAVENOUS

## 2015-06-24 NOTE — Discharge Instructions (Signed)
Abdominal Pain, Adult Many things can cause abdominal pain. Usually, abdominal pain is not caused by a disease and will improve without treatment. It can often be observed and treated at home. Your health care provider will do a physical exam and possibly order blood tests and X-rays to help determine the seriousness of your pain. However, in many cases, more time must pass before a clear cause of the pain can be found. Before that point, your health care provider may not know if you need more testing or further treatment. HOME CARE INSTRUCTIONS Monitor your abdominal pain for any changes. The following actions may help to alleviate any discomfort you are experiencing:  Only take over-the-counter or prescription medicines as directed by your health care provider.  Do not take laxatives unless directed to do so by your health care provider.  Try a clear liquid diet (broth, tea, or water) as directed by your health care provider. Slowly move to a bland diet as tolerated. SEEK MEDICAL CARE IF:  You have unexplained abdominal pain.  You have abdominal pain associated with nausea or diarrhea.  You have pain when you urinate or have a bowel movement.  You experience abdominal pain that wakes you in the night.  You have abdominal pain that is worsened or improved by eating food.  You have abdominal pain that is worsened with eating fatty foods.  You have a fever. SEEK IMMEDIATE MEDICAL CARE IF:  Your pain does not go away within 2 hours.  You keep throwing up (vomiting).  Your pain is felt only in portions of the abdomen, such as the right side or the left lower portion of the abdomen.  You pass bloody or black tarry stools. MAKE SURE YOU:  Understand these instructions.  Will watch your condition.  Will get help right away if you are not doing well or get worse.   This information is not intended to replace advice given to you by your health care provider. Make sure you discuss  any questions you have with your health care provider.   Document Released: 04/02/2005 Document Revised: 03/14/2015 Document Reviewed: 03/02/2013 Elsevier Interactive Patient Education 2016 [place].  Diarrhea Diarrhea is frequent loose and watery bowel movements. It can cause you to feel weak and dehydrated. Dehydration can cause you to become tired and thirsty, have a dry mouth, and have decreased urination that often is dark yellow. Diarrhea is a sign of another problem, most often an infection that will not last long. In most cases, diarrhea typically lasts 2-3 days. However, it can last longer if it is a sign of something more serious. It is important to treat your diarrhea as directed by your caregiver to lessen or prevent future episodes of diarrhea. CAUSES  Some common causes include:  Gastrointestinal infections caused by viruses, bacteria, or parasites.  Food poisoning or food allergies.  Certain medicines, such as antibiotics, chemotherapy, and laxatives.  Artificial sweeteners and fructose.  Digestive disorders. HOME CARE INSTRUCTIONS  Ensure adequate fluid intake (hydration): Have 1 cup (8 oz) of fluid for each diarrhea episode. Avoid fluids that contain simple sugars or sports drinks, fruit juices, whole milk products, and sodas. Your urine should be clear or pale yellow if you are drinking enough fluids. Hydrate with an oral rehydration solution that you can purchase at pharmacies, retail stores, and online. You can prepare an oral rehydration solution at home by mixing the following ingredients together:   - tsp table salt.   tsp baking soda.  tsp salt substitute containing potassium chloride.  1  tablespoons sugar.  1 L (34 oz) of water.  Certain foods and beverages may increase the speed at which food moves through the gastrointestinal (GI) tract. These foods and beverages should be avoided and include:  Caffeinated and alcoholic beverages.  High-fiber  foods, such as raw fruits and vegetables, nuts, seeds, and whole grain breads and cereals.  Foods and beverages sweetened with sugar alcohols, such as xylitol, sorbitol, and mannitol.  Some foods may be well tolerated and may help thicken stool including:  Starchy foods, such as rice, toast, pasta, low-sugar cereal, oatmeal, grits, baked potatoes, crackers, and bagels.  Bananas.  Applesauce.  Add probiotic-rich foods to help increase healthy bacteria in the GI tract, such as yogurt and fermented milk products.  Wash your hands well after each diarrhea episode.  Only take over-the-counter or prescription medicines as directed by your caregiver.  Take a warm bath to relieve any burning or pain from frequent diarrhea episodes. SEEK IMMEDIATE MEDICAL CARE IF:   You are unable to keep fluids down.  You have persistent vomiting.  You have blood in your stool, or your stools are black and tarry.  You do not urinate in 6-8 hours, or there is only a small amount of very dark urine.  You have abdominal pain that increases or localizes.  You have weakness, dizziness, confusion, or light-headedness.  You have a severe headache.  Your diarrhea gets worse or does not get better.  You have a fever or persistent symptoms for more than 2-3 days.  You have a fever and your symptoms suddenly get worse. MAKE SURE YOU:   Understand these instructions.  Will watch your condition.  Will get help right away if you are not doing well or get worse.   This information is not intended to replace advice given to you by your health care provider. Make sure you discuss any questions you have with your health care provider.   Document Released: 06/13/2002 Document Revised: 07/14/2014 Document Reviewed: 02/29/2012 Elsevier Interactive Patient Education 2016 Elsevier Inc.  Nausea and Vomiting Nausea is a sick feeling that often comes before throwing up (vomiting). Vomiting is a reflex where  stomach contents come out of your mouth. Vomiting can cause severe loss of body fluids (dehydration). Children and elderly adults can become dehydrated quickly, especially if they also have diarrhea. Nausea and vomiting are symptoms of a condition or disease. It is important to find the cause of your symptoms. CAUSES   Direct irritation of the stomach lining. This irritation can result from increased acid production (gastroesophageal reflux disease), infection, food poisoning, taking certain medicines (such as nonsteroidal anti-inflammatory drugs), alcohol use, or tobacco use.  Signals from the brain.These signals could be caused by a headache, heat exposure, an inner ear disturbance, increased pressure in the brain from injury, infection, a tumor, or a concussion, pain, emotional stimulus, or metabolic problems.  An obstruction in the gastrointestinal tract (bowel obstruction).  Illnesses such as diabetes, hepatitis, gallbladder problems, appendicitis, kidney problems, cancer, sepsis, atypical symptoms of a heart attack, or eating disorders.  Medical treatments such as chemotherapy and radiation.  Receiving medicine that makes you sleep (general anesthetic) during surgery. DIAGNOSIS Your caregiver may ask for tests to be done if the problems do not improve after a few days. Tests may also be done if symptoms are severe or if the reason for the nausea and vomiting is not clear. Tests may include:  Urine tests.  Blood tests.  Stool tests.  Cultures (to look for evidence of infection).  X-rays or other imaging studies. Test results can help your caregiver make decisions about treatment or the need for additional tests. TREATMENT You need to stay well hydrated. Drink frequently but in small amounts.You may wish to drink water, sports drinks, clear broth, or eat frozen ice pops or gelatin dessert to help stay hydrated.When you eat, eating slowly may help prevent nausea.There are also some  antinausea medicines that may help prevent nausea. HOME CARE INSTRUCTIONS   Take all medicine as directed by your caregiver.  If you do not have an appetite, do not force yourself to eat. However, you must continue to drink fluids.  If you have an appetite, eat a normal diet unless your caregiver tells you differently.  Eat a variety of complex carbohydrates (rice, wheat, potatoes, bread), lean meats, yogurt, fruits, and vegetables.  Avoid high-fat foods because they are more difficult to digest.  Drink enough water and fluids to keep your urine clear or pale yellow.  If you are dehydrated, ask your caregiver for specific rehydration instructions. Signs of dehydration may include:  Severe thirst.  Dry lips and mouth.  Dizziness.  Dark urine.  Decreasing urine frequency and amount.  Confusion.  Rapid breathing or pulse. SEEK IMMEDIATE MEDICAL CARE IF:   You have blood or brown flecks (like coffee grounds) in your vomit.  You have black or bloody stools.  You have a severe headache or stiff neck.  You are confused.  You have severe abdominal pain.  You have chest pain or trouble breathing.  You do not urinate at least once every 8 hours.  You develop cold or clammy skin.  You continue to vomit for longer than 24 to 48 hours.  You have a fever. MAKE SURE YOU:   Understand these instructions.  Will watch your condition.  Will get help right away if you are not doing well or get worse.   This information is not intended to replace advice given to you by your health care provider. Make sure you discuss any questions you have with your health care provider.   Document Released: 06/23/2005 Document Revised: 09/15/2011 Document Reviewed: 11/20/2010 Elsevier Interactive Patient Education [employer].

## 2015-06-25 ENCOUNTER — Telehealth: Payer: Self-pay | Admitting: General Surgery

## 2015-06-25 NOTE — Telephone Encounter (Signed)
Patient was seen in the ER over the weekend (Saturday 12/17) She has stones and sludge in her gallbladder and was told to call to make an appointment with [facility]. She also was diagnosed with an infection - enteritis / colitis - and given a prescription for antibiotics. I have scheduled her an appointment with Dr Adonis Huguenin for Wednesday (06/27/15) at 2:30pm. Patient would like to be seen sooner than that if possible - she wants to get her surgery scheduled before the end of the year. Also, she was not prescribed any pain medicine and is having moderate pain.

## 2015-06-27 ENCOUNTER — Ambulatory Visit (INDEPENDENT_AMBULATORY_CARE_PROVIDER_SITE_OTHER): Payer: Commercial Managed Care - PPO | Admitting: General Surgery

## 2015-06-27 ENCOUNTER — Encounter: Payer: Self-pay | Admitting: Medical Oncology

## 2015-06-27 ENCOUNTER — Emergency Department
Admission: EM | Admit: 2015-06-27 | Discharge: 2015-06-27 | Disposition: A | Discharge status: Home / Self Care | Payer: Commercial Managed Care - PPO | Attending: Emergency Medicine | Admitting: Emergency Medicine

## 2015-06-27 ENCOUNTER — Encounter: Payer: Self-pay | Admitting: General Surgery

## 2015-06-27 VITALS — BP 107/67 | HR 85 | Temp 98.3°F | Ht 66.0 in | Wt 135.0 lb

## 2015-06-27 DIAGNOSIS — Z880 Allergy status to penicillin: Secondary | ICD-10-CM | POA: Diagnosis not present

## 2015-06-27 DIAGNOSIS — Z91040 Latex allergy status: Secondary | ICD-10-CM | POA: Diagnosis not present

## 2015-06-27 DIAGNOSIS — Z792 Long term (current) use of antibiotics: Secondary | ICD-10-CM | POA: Diagnosis not present

## 2015-06-27 DIAGNOSIS — R109 Unspecified abdominal pain: Secondary | ICD-10-CM | POA: Diagnosis not present

## 2015-06-27 DIAGNOSIS — R112 Nausea with vomiting, unspecified: Secondary | ICD-10-CM | POA: Diagnosis not present

## 2015-06-27 DIAGNOSIS — Z3202 Encounter for pregnancy test, result negative: Secondary | ICD-10-CM | POA: Insufficient documentation

## 2015-06-27 DIAGNOSIS — Z87891 Personal history of nicotine dependence: Secondary | ICD-10-CM | POA: Diagnosis not present

## 2015-06-27 DIAGNOSIS — R197 Diarrhea, unspecified: Secondary | ICD-10-CM

## 2015-06-27 DIAGNOSIS — Z79899 Other long term (current) drug therapy: Secondary | ICD-10-CM | POA: Insufficient documentation

## 2015-06-27 HISTORY — DX: Unspecified abdominal pain: R10.9

## 2015-06-27 LAB — COMPREHENSIVE METABOLIC PANEL
ALT: 28 U/L (ref 14–54)
ANION GAP: 8 (ref 5–15)
AST: 19 U/L (ref 15–41)
Albumin: 4.3 g/dL (ref 3.5–5.0)
Alkaline Phosphatase: 73 U/L (ref 38–126)
BILIRUBIN TOTAL: 0.6 mg/dL (ref 0.3–1.2)
BUN: 7 mg/dL (ref 6–20)
CO2: 28 mmol/L (ref 22–32)
Calcium: 9.4 mg/dL (ref 8.9–10.3)
Chloride: 104 mmol/L (ref 101–111)
Creatinine, Ser: 0.72 mg/dL (ref 0.44–1.00)
Glucose, Bld: 99 mg/dL (ref 65–99)
POTASSIUM: 3.4 mmol/L — AB (ref 3.5–5.1)
Sodium: 140 mmol/L (ref 135–145)
TOTAL PROTEIN: 7.1 g/dL (ref 6.5–8.1)

## 2015-06-27 LAB — URINALYSIS COMPLETE WITH MICROSCOPIC (ARMC ONLY)
Bilirubin Urine: NEGATIVE
GLUCOSE, UA: NEGATIVE mg/dL
Hgb urine dipstick: NEGATIVE
Ketones, ur: NEGATIVE mg/dL
Leukocytes, UA: NEGATIVE
NITRITE: NEGATIVE
Protein, ur: NEGATIVE mg/dL
RBC / HPF: NONE SEEN RBC/hpf (ref 0–5)
SPECIFIC GRAVITY, URINE: 1.013 (ref 1.005–1.030)
pH: 7 (ref 5.0–8.0)

## 2015-06-27 LAB — CBC
HEMATOCRIT: 43.6 % (ref 35.0–47.0)
Hemoglobin: 14.4 g/dL (ref 12.0–16.0)
MCH: 29 pg (ref 26.0–34.0)
MCHC: 33 g/dL (ref 32.0–36.0)
MCV: 88 fL (ref 80.0–100.0)
Platelets: 316 10*3/uL (ref 150–440)
RBC: 4.95 MIL/uL (ref 3.80–5.20)
RDW: 13.6 % (ref 11.5–14.5)
WBC: 8.1 10*3/uL (ref 3.6–11.0)

## 2015-06-27 LAB — LIPASE, BLOOD: LIPASE: 30 U/L (ref 11–51)

## 2015-06-27 LAB — POCT PREGNANCY, URINE: PREG TEST UR: NEGATIVE

## 2015-06-27 MED ORDER — ONDANSETRON 4 MG PO TBDP
4.0000 mg | ORAL_TABLET | Freq: Once | ORAL | Status: AC
  Administered 2015-06-27: 4 mg via ORAL

## 2015-06-27 MED ORDER — ONDANSETRON 4 MG PO TBDP
ORAL_TABLET | ORAL | Status: AC
  Administered 2015-06-27: 4 mg via ORAL
  Filled 2015-06-27: qty 1

## 2015-06-27 MED ORDER — SODIUM CHLORIDE 0.9 % IV BOLUS (SEPSIS)
1000.0000 mL | Freq: Once | INTRAVENOUS | Status: AC
  Administered 2015-06-27: 1000 mL via INTRAVENOUS

## 2015-06-27 MED ORDER — DICYCLOMINE HCL 20 MG PO TABS: 20.0000 mg | ORAL_TABLET | Freq: Three times a day (TID) | ORAL | Status: DC | PRN

## 2015-06-27 MED ORDER — ONDANSETRON 4 MG PO TBDP: 4.0000 mg | ORAL_TABLET | Freq: Three times a day (TID) | ORAL | Status: DC | PRN

## 2015-06-27 MED ORDER — DICYCLOMINE HCL 10 MG/ML IM SOLN
20.0000 mg | Freq: Once | INTRAMUSCULAR | Status: AC
Start: 2015-06-27 — End: 2015-06-27
  Administered 2015-06-27: 20 mg via INTRAMUSCULAR
  Filled 2015-06-27: qty 2

## 2015-06-27 NOTE — Progress Notes (Signed)
Patient was seen in clinic at this time. After seeing Dr. Adonis Huguenin and exam done, patient was advised that she needs to proceed to [facility] emergency room at this time. Called ER and spoke with Marya Amsler, [place] Nurse to let him know patient will be coming over.

## 2015-06-27 NOTE — ED Notes (Signed)
Pt complained of nausea after drinking liquids.  Dr Archie Balboa notified.

## 2015-06-27 NOTE — ED Notes (Signed)
Pt states she cannot have BM.  States she has the urge, but only passing gas at this time.  Oral fluids given per Dr Archie Balboa request.

## 2015-06-27 NOTE — Progress Notes (Deleted)
Subjective:     Patient ID: Tammy Johnson, female   DOB: 1981/02/08, 34 y.o.   MRN: [id number]  HPI   Review of Systems     Objective:   Physical Exam     Assessment:     ***    Plan:     ***

## 2015-06-27 NOTE — Discharge Instructions (Signed)
Please seek medical attention for any high fevers, chest pain, shortness of breath, change in behavior, persistent vomiting, bloody stool or any other new or concerning symptoms. ° ° °Nausea and Vomiting °Nausea is a sick feeling that often comes before throwing up (vomiting). Vomiting is a reflex where stomach contents come out of your mouth. Vomiting can cause severe loss of body fluids (dehydration). Children and elderly adults can become dehydrated quickly, especially if they also have diarrhea. Nausea and vomiting are symptoms of a condition or disease. It is important to find the cause of your symptoms. °CAUSES  °· Direct irritation of the stomach lining. This irritation can result from increased acid production (gastroesophageal reflux disease), infection, food poisoning, taking certain medicines (such as nonsteroidal anti-inflammatory drugs), alcohol use, or tobacco use. °· Signals from the brain. These signals could be caused by a headache, heat exposure, an inner ear disturbance, increased pressure in the brain from injury, infection, a tumor, or a concussion, pain, emotional stimulus, or metabolic problems. °· An obstruction in the gastrointestinal tract (bowel obstruction). °· Illnesses such as diabetes, hepatitis, gallbladder problems, appendicitis, kidney problems, cancer, sepsis, atypical symptoms of a heart attack, or eating disorders. °· Medical treatments such as chemotherapy and radiation. °· Receiving medicine that makes you sleep (general anesthetic) during surgery. °DIAGNOSIS °Your caregiver may ask for tests to be done if the problems do not improve after a few days. Tests may also be done if symptoms are severe or if the reason for the nausea and vomiting is not clear. Tests may include: °· Urine tests. °· Blood tests. °· Stool tests. °· Cultures (to look for evidence of infection). °· X-rays or other imaging studies. °Test results can help your caregiver make decisions about treatment or the  need for additional tests. °TREATMENT °You need to stay well hydrated. Drink frequently but in small amounts. You may wish to drink water, sports drinks, clear broth, or eat frozen ice pops or gelatin dessert to help stay hydrated. When you eat, eating slowly may help prevent nausea. There are also some antinausea medicines that may help prevent nausea. °HOME CARE INSTRUCTIONS  °· Take all medicine as directed by your caregiver. °· If you do not have an appetite, do not force yourself to eat. However, you must continue to drink fluids. °· If you have an appetite, eat a normal diet unless your caregiver tells you differently. °¨ Eat a variety of complex carbohydrates (rice, wheat, potatoes, bread), lean meats, yogurt, fruits, and vegetables. °¨ Avoid high-fat foods because they are more difficult to digest. °· Drink enough water and fluids to keep your urine clear or pale yellow. °· If you are dehydrated, ask your caregiver for specific rehydration instructions. Signs of dehydration may include: °¨ Severe thirst. °¨ Dry lips and mouth. °¨ Dizziness. °¨ Dark urine. °¨ Decreasing urine frequency and amount. °¨ Confusion. °¨ Rapid breathing or pulse. °SEEK IMMEDIATE MEDICAL CARE IF:  °· You have blood or brown flecks (like coffee grounds) in your vomit. °· You have black or bloody stools. °· You have a severe headache or stiff neck. °· You are confused. °· You have severe abdominal pain. °· You have chest pain or trouble breathing. °· You do not urinate at least once every 8 hours. °· You develop cold or clammy skin. °· You continue to vomit for longer than 24 to 48 hours. °· You have a fever. °MAKE SURE YOU:  °· Understand these instructions. °· Will watch your condition. °· Will get help right away   if you are not doing well or get worse. °  °This information is not intended to replace advice given to you by your health care provider. Make sure you discuss any questions you have with your health care provider. °    °Document Released: 06/23/2005 Document Revised: 09/15/2011 Document Reviewed: 11/20/2010 °Elsevier Interactive Patient Education ©2016 Elsevier Inc. ° °

## 2015-06-27 NOTE — ED Provider Notes (Signed)
[facility] Emergency Department Provider Note    ____________________________________________  Time seen: 1620  I have reviewed the triage vital signs and the nursing notes.   HISTORY  Chief Complaint Abdominal Pain   History limited by: Not Limited   HPI Tammy Johnson is a 34 y.o. female who presents to the emergency department today because of concerns for continued nausea and vomiting. The patient states she was seen in the emergency department 4 days ago and diagnosed with colitis. She has been taking her antibiotic. In addition the found evidence of a cholelithiasis so she went to follow-up at the surgeon's office today. The surgeon did not think that her pain nausea vomiting and diarrhea were related to her cholelithiasis and thought she should be reevaluated in the emergency department. Patient states the symptoms have been going on for roughly 10 days and that she has not been able to hold down any fluids. She has had some diffuse abdominal pain. She denies any fevers.   Past Medical History  Diagnosis Date  . Asthma   . Migraine   . Cancer [facility]) 2005 and 2008    breast right side   . TB lung, latent 2014    + TB Skin test; Prophalactic Rofampin    Patient Active Problem List   Diagnosis Date Noted  . Abdominal pain 06/27/2015  . Migraines 11/13/2013  . Malignant neoplasm of breast ([place]) 10/18/2013  . Calculus of kidney 10/18/2013  . 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    Past Surgical History  Procedure Laterality Date  . Rt breast mass    . Laparoscopic endometriosis fulguration  1999  . Breast surgery Right 2005    Lumpectomy    Current Outpatient Rx  Name  Route  Sig  Dispense  Refill  . ciprofloxacin (CIPRO) 500 MG tablet   Oral   Take 1 tablet (500 mg total) by mouth 2 (two) times daily.   10 tablet   0   . EPINEPHrine 0.3 mg/0.3 mL IJ SOAJ  injection   Intramuscular   Inject 0.3 mLs (0.3 mg total) into the muscle once.   1 Device   1   . ibuprofen (ADVIL,MOTRIN) 600 MG tablet   Oral   Take 1 tablet (600 mg total) by mouth every 8 (eight) hours as needed.   15 tablet   0   . promethazine (PHENERGAN) 25 MG suppository   Rectal   Place 1 suppository (25 mg total) rectally every 6 (six) hours as needed for nausea.   12 suppository   0     Allergies Bee venom; Penicillins; Dapsone; Clindamycin/lincomycin; and Latex  Family History  Problem Relation Age of Onset  . Cervical cancer Maternal Grandmother   . Heart disease Maternal Grandmother   . Cancer - Colon Maternal Grandmother   . Cancer Mother     breast   . Depression Brother   . Bipolar disorder Brother   . Schizophrenia Brother     Social History Social History  Substance Use Topics  . Smoking status: Former Smoker    Quit date: 03/18/2014  . Smokeless tobacco: Never Used  . Alcohol Use: Yes     Comment: Occasional    Review of Systems  Constitutional: Negative for fever. Cardiovascular: Negative for chest pain. Respiratory: Negative for shortness of breath. Gastrointestinal: Positive for abdominal pain, vomiting and diarrhea Neurological: Negative for headaches, focal weakness or numbness.  10-point ROS otherwise negative.  ____________________________________________   PHYSICAL EXAM:  VITAL SIGNS: ED Triage Vitals  Enc Vitals Group     BP 06/27/15 1531 120/60 mmHg     Pulse Rate 06/27/15 1531 86     Resp 06/27/15 1531 18     Temp 06/27/15 1531 98.5 F (36.9 C)     Temp Source 06/27/15 1531 Oral     SpO2 06/27/15 1531 98 %     Weight 06/27/15 1531 135 lb (61.236 kg)     Height 06/27/15 1531 5\' 6"  (1.676 m)     Head Cir --      Peak Flow --      Pain Score 06/27/15 1531 5   Constitutional: Alert and oriented. Well appearing and in no distress. Eyes: Conjunctivae are normal. PERRL. Normal extraocular movements. ENT   Head:  Normocephalic and atraumatic.   Nose: No congestion/rhinnorhea.   Mouth/Throat: Mucous membranes are moist.   Neck: No stridor. Hematological/Lymphatic/Immunilogical: No cervical lymphadenopathy. Cardiovascular: Normal rate, regular rhythm.  No murmurs, rubs, or gallops. Respiratory: Normal respiratory effort without tachypnea nor retractions. Breath sounds are clear and equal bilaterally. No wheezes/rales/rhonchi. Gastrointestinal: Soft and minimally tender to palpation somewhat diffusely. Perhaps slightly worse in the right upper quadrant. No rebound. No guarding. Genitourinary: Deferred Musculoskeletal: Normal range of motion in all extremities. No joint effusions.  No lower extremity tenderness nor edema. Neurologic:  Normal speech and language. No gross focal neurologic deficits are appreciated.  Skin:  Skin is warm, dry and intact. No rash noted. Psychiatric: Mood and affect are normal. Speech and behavior are normal. Patient exhibits appropriate insight and judgment.  ____________________________________________    LABS (pertinent positives/negatives)  Labs Reviewed  COMPREHENSIVE METABOLIC PANEL - Abnormal; Notable for the following:    Potassium 3.4 (*)    All other components within normal limits  LIPASE, BLOOD  CBC  URINALYSIS COMPLETEWITH MICROSCOPIC ([facility] ONLY)  POC URINE PREG, ED     ____________________________________________   EKG  None  ____________________________________________    RADIOLOGY  None  ____________________________________________   PROCEDURES  Procedure(s) performed: None  Critical Care performed: No  ____________________________________________   INITIAL IMPRESSION / ASSESSMENT AND PLAN / ED COURSE  Pertinent labs & imaging results that were available during my care of the patient were reviewed by me and considered in my medical decision making (see chart for details).  Patient presented today because of concerns  for continued nausea vomiting and diarrhea. Blood work here without any concerning findings potassium minimally decreased at 3.4 otherwise her electrolytes are within normal limits. No creatinine elevation. Additionally no ketones in the urine. No elevation of the white blood cell count. This point I do not think repeat imaging is warranted. Patient was given IV fluids here in the emergency department was audible to tolerate by mouth water and juice prior to discharge. Will discharge home with Zofran and Bentyl.   ____________________________________________   FINAL CLINICAL IMPRESSION(S) / ED DIAGNOSES  Final diagnoses:  Diarrhea, unspecified type  Nausea and vomiting, vomiting of unspecified type     Nance Pear, MD 06/27/15 2306

## 2015-06-27 NOTE — Patient Instructions (Signed)
We will send you back to the ER at this time to get you hydrated and get IV antibiotics to treat the previously diagnosed Colitis.

## 2015-06-27 NOTE — ED Notes (Signed)
Pt discharged to home.  Pt in NAD.  Discharge instructions reviewed.  Teach back verified.  No questions or concerns at this time.  Items with pt upon discharge.

## 2015-06-27 NOTE — ED Notes (Signed)
Patient states that she was seen her on 12/17 and diagnosed with colitis and cholelithiasis. Patient went to seen the surgeon today and was sent back to the ED. Patient states that she is unable to tolerate PO intake, she is unable to keep food or liquids down. Patient has been using phenergan suppositories without relief.

## 2015-06-27 NOTE — Progress Notes (Signed)
Patient ID: Tammy Johnson, female   DOB: 1980/12/14, 34 y.o.   MRN: [id number]  CC: ABDOMINAL PAIN  HPI Tammy Johnson is a 34 y.o. female  Presents to clinic for follow-up of abdominal pain. Patient states that for the last 2 weeks she has been unable to keep anything down orally. She's been having nausea, vomiting, diarrhea. She's had explosive watery diarrhea. She states that her what she eats or drinks she can't keep it down. She was in the emergency department 3 days ago for this and was diagnosed with colitis versus enteritis was also noted to have cholelithiasis. Patient complains of pain over her entire abdomen and right and left-sided. Patient states she's been having use Phenergan suppositories  In order to take her prescribed antibiotics. Patient states that since being seen in the emergency department she is continued to have nausea and vomiting with by mouth intolerance and watery diarrhea.  HPI  Past Medical History  Diagnosis Date  . Asthma   . Migraine   . Cancer [facility]) 2005 and 2008    breast right side   . TB lung, latent 2014    + TB Skin test; Prophalactic Rofampin    Past Surgical History  Procedure Laterality Date  . Rt breast mass    . Laparoscopic endometriosis fulguration  1999  . Breast surgery Right 2005    Lumpectomy    Family History  Problem Relation Age of Onset  . Cervical cancer Maternal Grandmother   . Heart disease Maternal Grandmother   . Cancer - Colon Maternal Grandmother   . Cancer Mother     breast   . Depression Brother   . Bipolar disorder Brother   . Schizophrenia Brother     Social History Social History  Substance Use Topics  . Smoking status: Former Smoker    Quit date: 03/18/2014  . Smokeless tobacco: Never Used  . Alcohol Use: Yes     Comment: Occasional    Current Outpatient Prescriptions  Medication Sig Dispense Refill  . ciprofloxacin (CIPRO) 500 MG tablet Take 1 tablet (500 mg total) by mouth 2 (two) times daily. 10  tablet 0  . EPINEPHrine 0.3 mg/0.3 mL IJ SOAJ injection Inject 0.3 mLs (0.3 mg total) into the muscle once. 1 Device 1  . ibuprofen (ADVIL,MOTRIN) 600 MG tablet Take 1 tablet (600 mg total) by mouth every 8 (eight) hours as needed. 15 tablet 0  . promethazine (PHENERGAN) 25 MG suppository Place 1 suppository (25 mg total) rectally every 6 (six) hours as needed for nausea. 12 suppository 0   No current facility-administered medications for this visit.     Review of Systems A  Multi-point review of systems was asked and was negative except for the findings documented in the history of present illness  Physical Exam Blood pressure 107/67, pulse 85, temperature 98.3 F (36.8 C), temperature source Oral, height 5\' 6"  (1.676 m), weight 61.236 kg (135 lb), last menstrual period 06/14/2015. CONSTITUTIONAL:  No obvious distress. EYES: Pupils are equal, round, and reactive to light, Sclera are non-icteric. EARS, NOSE, MOUTH AND THROAT: The oropharynx is clear. The oral mucosa is pink and moist. Hearing is intact to voice. LYMPH NODES:  Lymph nodes in the neck are normal. RESPIRATORY:  Lungs are clear. There is normal respiratory effort, with equal breath sounds bilaterally, and without pathologic use of accessory muscles. CARDIOVASCULAR: Heart is regular without murmurs, gallops, or rubs. GI: The abdomen is  Very slender, soft, obvious reducible umbilical  hernia, no reproducible abdominal pain, and nondistended. There are no palpable masses. There is no hepatosplenomegaly. There are normal bowel sounds in all quadrants. Patient tolerates deep palpation in all quadrants without grimacing or guarding. She makes statements of right upper quadrant pain on deep palpation has a negative Murphy sign. Make statements of pain to deep palpation in the right lower quadrant and left lower quadrant. GU: Rectal deferred.   MUSCULOSKELETAL: Normal muscle strength and tone. No cyanosis or edema.   SKIN: Turgor is good  and there are no pathologic skin lesions or ulcers. NEUROLOGIC: Motor and sensation is grossly normal. Cranial nerves are grossly intact. PSYCH:  Oriented to person, place and time. Affect is normal.  Data Reviewed  images and labs reviewed. Images are consistent with an enteritis and cholelithiasis without cholecystitis. Labs show a possibleurinary tract infection and hypokalemia. I have personally reviewed the patient's imaging, laboratory findings and medical records.    Assessment     34 year old female with by mouth intolerance and abdominal pain. Likely colitis versus enteritis.     Plan     discussed with the patient in detail that if she truly is not able to tolerate anything by mouth for the last 10-11 days that she cannot go home. By mouth intolerance with prolonged nausea, vomiting , diarrhea is a set up for dehydration and could lead to her being extremely ill. Discussed that her symptoms and history that she is describing are not consistent with biliary colic.  I could not state whether or not any of her abdominal pain is related to her gallbladder or not however 2 weeks of constant pain with nausea, vomiting, diarrhea and normal LFTs and a normal ultrasound our evidence that this current bout of sickness is not likely to be biliary colic. Strongly encouraged patient to seek continued care in the emergency department for IV hydration and continued workup for her enteritis as this is her most pressing issue. She can follow up with surgery as-needed basis after this current event is resolved.     Time spent with the patient was 60 minutes, with more than 50% of the time spent in face-to-face education, counseling and care coordination.     Clayburn Pert, MD FACS General Surgeon 06/27/2015, 2:39 PM

## 2015-06-27 NOTE — ED Notes (Signed)
Pt reports that she was seen here on 12/18 with abd pain, nausea and vomiting. Pt reports she was told that she followed-up with Pat Patrick today and was told that she shouldve been admitted for IV abx.

## 2016-02-25 ENCOUNTER — Other Ambulatory Visit (HOSPITAL_COMMUNITY): Payer: Self-pay | Admitting: Family

## 2016-02-25 DIAGNOSIS — Z1231 Encounter for screening mammogram for malignant neoplasm of breast: Secondary | ICD-10-CM

## 2016-03-03 ENCOUNTER — Ambulatory Visit (HOSPITAL_COMMUNITY)
Admission: RE | Admit: 2016-03-03 | Discharge: 2016-03-03 | Disposition: A | Discharge status: Home / Self Care | Payer: Commercial Managed Care - PPO | Source: Ambulatory Visit | Attending: Family | Admitting: Family

## 2016-03-03 DIAGNOSIS — Z1231 Encounter for screening mammogram for malignant neoplasm of breast: Secondary | ICD-10-CM | POA: Insufficient documentation

## 2016-03-14 ENCOUNTER — Emergency Department: Payer: Commercial Managed Care - PPO

## 2016-03-14 ENCOUNTER — Emergency Department
Admission: EM | Admit: 2016-03-14 | Discharge: 2016-03-14 | Disposition: A | Discharge status: Home / Self Care | Payer: Commercial Managed Care - PPO | Attending: Emergency Medicine | Admitting: Emergency Medicine

## 2016-03-14 DIAGNOSIS — S9032XA Contusion of left foot, initial encounter: Secondary | ICD-10-CM | POA: Diagnosis not present

## 2016-03-14 DIAGNOSIS — J45909 Unspecified asthma, uncomplicated: Secondary | ICD-10-CM | POA: Diagnosis not present

## 2016-03-14 DIAGNOSIS — Y92096 Garden or yard of other non-institutional residence as the place of occurrence of the external cause: Secondary | ICD-10-CM | POA: Diagnosis not present

## 2016-03-14 DIAGNOSIS — Y999 Unspecified external cause status: Secondary | ICD-10-CM | POA: Diagnosis not present

## 2016-03-14 DIAGNOSIS — Z853 Personal history of malignant neoplasm of breast: Secondary | ICD-10-CM | POA: Insufficient documentation

## 2016-03-14 DIAGNOSIS — S99922A Unspecified injury of left foot, initial encounter: Secondary | ICD-10-CM | POA: Diagnosis present

## 2016-03-14 DIAGNOSIS — Z87891 Personal history of nicotine dependence: Secondary | ICD-10-CM | POA: Diagnosis not present

## 2016-03-14 DIAGNOSIS — W5589XA Other contact with other mammals, initial encounter: Secondary | ICD-10-CM | POA: Diagnosis not present

## 2016-03-14 DIAGNOSIS — Y939 Activity, unspecified: Secondary | ICD-10-CM | POA: Diagnosis not present

## 2016-03-14 MED ORDER — NAPROXEN 500 MG PO TABS: 500.0000 mg | ORAL_TABLET | Freq: Two times a day (BID) | ORAL | 0 refills | Status: DC

## 2016-03-14 NOTE — ED Triage Notes (Signed)
Pt reports to ED w/ c/o L foot injury.  Pt sts that cow stomped on foot.  Able to move/feel 3/5 toes w/ issue.  A/Ox4. Denies CP, SOB, LOC, fever or dizziness.  NAD.

## 2016-03-14 NOTE — ED Provider Notes (Signed)
[facility] Emergency Department Provider Note  ____________________________________________  Time seen: Approximately 9:28 PM  I have reviewed the triage vital signs and the nursing notes.   HISTORY  Chief Complaint Foot Injury    HPI Tammy Johnson is a 35 y.o. female, NAD, presents to the emergency department with injury to her left foot several hours ago. Patient states that her goats got out and she was out in the yard without boots on when her daughter's cow stomped on her left foot around 5pm today. She has been using crutches when ambulating and trying not to put pressure on the foot. She picked up her daughter from school and the family stopped and ate dinner before coming to the emergency department. The patient has bruising, swelling and 6/10 pain that is constant that worsens with weight bearing. Also reports decreased sensation in 3rd-5th toes. Patient took an Advil earlier for pain. Denies any open wounds or lacerations.   Past Medical History:  Diagnosis Date  . Asthma   . Cancer [facility]) 2005 and 2008   breast right side   . Migraine   . TB lung, latent 2014   + TB Skin test; Prophalactic Rofampin    Patient Active Problem List   Diagnosis Date Noted  . Abdominal pain 06/27/2015  . Migraines 11/13/2013  . Malignant neoplasm of breast ([place]) 10/18/2013  . Calculus of kidney 10/18/2013  . 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 BREAST SURGERY Right 2005   Lumpectomy  . LAPAROSCOPIC ENDOMETRIOSIS FULGURATION  1999  . RT BREAST MASS      Prior to Admission medications   Medication Sig Start Date End Date Taking? Authorizing Provider  dicyclomine (BENTYL) 20 MG tablet Take 1 tablet (20 mg total) by mouth 3 (three) times daily as needed for spasms. 06/27/15 06/26/16  Nance Pear, MD  EPINEPHrine 0.3 mg/0.3  mL IJ SOAJ injection Inject 0.3 mLs (0.3 mg total) into the muscle once. 04/10/15   [name], MD  ibuprofen (ADVIL,MOTRIN) 600 MG tablet Take 1 tablet (600 mg total) by mouth every 8 (eight) hours as needed. 05/20/15   [name], MD  naproxen (NAPROSYN) 500 MG tablet Take 1 tablet (500 mg total) by mouth 2 (two) times daily with a meal. 03/14/16   Jami L Hagler, PA-C  ondansetron (ZOFRAN ODT) 4 MG disintegrating tablet Take 1 tablet (4 mg total) by mouth every 8 (eight) hours as needed for nausea or vomiting. 06/27/15   Nance Pear, MD  promethazine (PHENERGAN) 25 MG suppository Place 1 suppository (25 mg total) rectally every 6 (six) hours as needed for nausea. 06/24/15 06/23/16  Loney Hering, MD    Allergies Bee venom; Penicillins; Dapsone; Clindamycin/lincomycin; and Latex  Family History  Problem Relation Age of Onset  . Cervical cancer Maternal Grandmother   . Heart disease Maternal Grandmother   . Cancer - Colon Maternal Grandmother   . Cancer Mother     breast   . Depression Brother   . Bipolar disorder Brother   . Schizophrenia Brother     Social History Social History  Substance Use Topics  . Smoking status: Former Smoker    Quit date: 03/18/2014  . Smokeless tobacco: Never Used  . Alcohol use Yes     Comment: Occasional     Review of Systems  Constitutional: No fever/chills Musculoskeletal: Positive for  left foot pain. No ankle pain.  Skin: Positive swelling and bruising left foot. Negative for rash. Neurological: Numbness left foot. Negative for headaches, focal weakness or tingling. 10-point ROS otherwise negative.  ____________________________________________   PHYSICAL EXAM:  VITAL SIGNS: ED Triage Vitals  Enc Vitals Group     BP 03/14/16 2054 (!) 111/51     Pulse Rate 03/14/16 2054 93     Resp 03/14/16 2054 18     Temp 03/14/16 2054 98.2 F (36.8 C)     Temp Source 03/14/16 2054 Oral     SpO2 03/14/16 2054 99 %     Weight 03/14/16  2055 135 lb (61.2 kg)     Height 03/14/16 2055 5\' 6"  (1.676 m)     Head Circumference --      Peak Flow --      Pain Score 03/14/16 2109 6     Pain Loc --      Pain Edu? --      Excl. in [place]? --      Constitutional: Alert and oriented. Well appearing and in no acute distress. Eyes: Conjunctivae are normal.  Head: Atraumatic. Cardiovascular: 2+ peripheral pulses noted in the left lower extremity. Capillary refill is brisk in all digits of left foot. Respiratory: Normal respiratory effort without tachypnea or retractions.  Musculoskeletal: Tenderness to palpation about the proximal, lateral, dorsal left foot without bony abnormality or crepitus. Decreased range of motion of the left ankle due to pain with full flexion and extension. No tenderness to palpation of the left ankle and lower leg. No edema. Neurologic:  Normal speech and language. No gross focal neurologic deficits are appreciated.Sensation to light touch grossly intact throughout the left lower extremity.  Skin:  Blue/red ecchymosis noted about the proximal, lateral, dorsal left foot without open wounds or lesions. No skin sores. Skin is warm, dry and intact. No rash noted. Psychiatric: Mood and affect are normal. Speech and behavior are normal. Patient exhibits appropriate insight and judgement.    ____________________________________________   LABS  None ____________________________________________  EKG  None ____________________________________________  RADIOLOGY I have personally viewed and evaluated these images (plain radiographs) as part of my medical decision making, as well as reviewing the written report by the radiologist.  Dg Foot Complete Left  Result Date: 03/14/2016 CLINICAL DATA:  35 y/o F; fourth and fifth metatarsal pain after getting stepped on by a cow today EXAM: LEFT FOOT - COMPLETE 3+ VIEW COMPARISON:  None. FINDINGS: There is no evidence of fracture or dislocation. There is no evidence of  arthropathy or other focal bone abnormality. Soft tissues are unremarkable. IMPRESSION: Negative. Electronically Signed   By: Kristine Garbe M.D.   On: 03/14/2016 21:52    ____________________________________________    PROCEDURES  Procedure(s) performed: None   Procedures   Medications - No data to display   ____________________________________________   INITIAL IMPRESSION / ASSESSMENT AND PLAN / ED COURSE  Pertinent labs & imaging results that were available during my care of the patient were reviewed by me and considered in my medical decision making (see chart for details).  Clinical Course    Patient's diagnosis is consistent with Contusion of left foot. Patient was placed in a postop shoe and may continue to use her crutches for support care. Patient will be discharged home with prescriptions for naproxen to take as directed. Patient is to apply ice to the affected area 20 minutes 3-4 times daily to elevated for the next few days.  Patient  is to follow up with Dr. Roland Rack in orthopedics if symptoms persist past this treatment course. Patient is given ED precautions to return to the ED for any worsening or new symptoms.      ____________________________________________  FINAL CLINICAL IMPRESSION(S) / ED DIAGNOSES  Final diagnoses:  Contusion of left foot, initial encounter      NEW MEDICATIONS STARTED DURING THIS VISIT:  Discharge Medication List as of 03/14/2016 10:07 PM    START taking these medications   Details  naproxen (NAPROSYN) 500 MG tablet Take 1 tablet (500 mg total) by mouth 2 (two) times daily with a meal., Starting Fri 03/14/2016, Print            Judithe Modest Sunray, PA-C 03/14/16 2252    Lisa Roca, MD 03/15/16 0010

## 2016-11-15 ENCOUNTER — Emergency Department (HOSPITAL_COMMUNITY)
Admission: EM | Admit: 2016-11-15 | Discharge: 2016-11-16 | Disposition: A | Discharge status: Home / Self Care | Payer: No Typology Code available for payment source | Attending: Emergency Medicine | Admitting: Emergency Medicine

## 2016-11-15 ENCOUNTER — Encounter (HOSPITAL_COMMUNITY): Payer: Self-pay | Admitting: Emergency Medicine

## 2016-11-15 DIAGNOSIS — Z87891 Personal history of nicotine dependence: Secondary | ICD-10-CM | POA: Diagnosis not present

## 2016-11-15 DIAGNOSIS — Z853 Personal history of malignant neoplasm of breast: Secondary | ICD-10-CM | POA: Insufficient documentation

## 2016-11-15 DIAGNOSIS — K8050 Calculus of bile duct without cholangitis or cholecystitis without obstruction: Secondary | ICD-10-CM

## 2016-11-15 DIAGNOSIS — J45909 Unspecified asthma, uncomplicated: Secondary | ICD-10-CM | POA: Diagnosis not present

## 2016-11-15 DIAGNOSIS — R1013 Epigastric pain: Secondary | ICD-10-CM | POA: Diagnosis present

## 2016-11-15 MED ORDER — ONDANSETRON HCL 4 MG/2ML IJ SOLN
4.0000 mg | Freq: Once | INTRAMUSCULAR | Status: AC
  Administered 2016-11-15: 4 mg via INTRAVENOUS
  Filled 2016-11-15: qty 2

## 2016-11-15 MED ORDER — MORPHINE SULFATE (PF) 4 MG/ML IV SOLN
4.0000 mg | Freq: Once | INTRAVENOUS | Status: AC
  Administered 2016-11-15: 4 mg via INTRAVENOUS
  Filled 2016-11-15: qty 1

## 2016-11-15 MED ORDER — SODIUM CHLORIDE 0.9 % IV BOLUS (SEPSIS)
1000.0000 mL | Freq: Once | INTRAVENOUS | Status: AC
  Administered 2016-11-15: 1000 mL via INTRAVENOUS

## 2016-11-15 NOTE — ED Provider Notes (Signed)
[name] Provider Note   CSN: [id number] Arrival date & time: 11/15/16  2218  By signing my name below, I, Tammy Johnson, attest that this documentation has been prepared under the direction and in the presence of Delora Fuel, MD. Electronically Signed: Jeanell Johnson, Scribe. 11/15/2016. 11:44 PM.  History   Chief Complaint Chief Complaint  Patient presents with  . Abdominal Pain   The history is provided by the patient. No language interpreter was used.   HPI Comments: Tammy Johnson is a 36 y.o. female who presents to the Emergency Department complaining of constant moderate epigastric abdominal pain that started yesterday. She has seen a surgeon for similar pain in the past and was given IV antibiotics. She suspected she had gallstones then. She describes the pain as worse with eating (especially greasy/spicy food), radiating to her back, and a 6/10. She reports associated nausea and vomiting. She is unable to keep down solids due to pain and vomiting. Denies any other complaints at this time.    PCP: [facility]  Past Medical History:  Diagnosis Date  . Asthma   . Cancer [facility]) 2005 and 2008   breast right side   . Migraine   . TB lung, latent 2014   + TB Skin test; Prophalactic Rofampin    Patient Active Problem List   Diagnosis Date Noted  . Abdominal pain 06/27/2015  . Migraines 11/13/2013  . Malignant neoplasm of breast ([place]) 10/18/2013  . Calculus of kidney 10/18/2013  . 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 BREAST SURGERY Right 2005   Lumpectomy  . LAPAROSCOPIC ENDOMETRIOSIS FULGURATION  1999  . RT BREAST MASS      OB History    Gravida Para Term Preterm AB Living   2 2 2     2    SAB TAB Ectopic Multiple Live Births                   Home Medications    Prior to Admission  medications   Medication Sig Start Date End Date Taking? Authorizing Provider  aspirin-acetaminophen-caffeine (EXCEDRIN EXTRA STRENGTH) [phone] MG tablet Take 1 tablet by mouth every 6 (six) hours as needed for headache.   Yes [provider]  EPINEPHrine 0.3 mg/0.3 mL IJ SOAJ injection Inject 0.3 mLs (0.3 mg total) into the muscle once. 04/10/15  Yes [name], MD    Family History Family History  Problem Relation Age of Onset  . Cervical cancer Maternal Grandmother   . Heart disease Maternal Grandmother   . Cancer - Colon Maternal Grandmother   . Cancer Mother        breast   . Depression Brother   . Bipolar disorder Brother   . Schizophrenia Brother     Social History Social History  Substance Use Topics  . Smoking status: Former Smoker    Quit date: 03/18/2014  . Smokeless tobacco: Never Used  . Alcohol use Yes     Comment: Occasional     Allergies   Bee venom; Penicillins; Dapsone; Clindamycin/lincomycin; and Latex   Review of Systems Review of Systems  Gastrointestinal: Positive for abdominal pain (Epigastric), nausea and vomiting.  Musculoskeletal: Positive for back pain.  All other systems reviewed and are negative.    Physical Exam Updated Vital Signs BP 101/62 (BP Location: Left Arm)   Pulse  78   Temp 98.2 F (36.8 C) (Oral)   Resp 18   Ht 5\' 6"  (1.676 m)   Wt 140 lb (63.5 kg)   LMP 10/28/2016 (Approximate)   SpO2 100%   BMI 22.60 kg/m   Physical Exam  Constitutional: She is oriented to person, place, and time. She appears well-developed and well-nourished.  HENT:  Head: Normocephalic and atraumatic.  Eyes: EOM are normal. Pupils are equal, round, and reactive to light.  Neck: Normal range of motion. Neck supple. No JVD present.  Cardiovascular: Normal rate, regular rhythm and normal heart sounds.   No murmur heard. Pulmonary/Chest: Effort normal and breath sounds normal. She has no wheezes. She has no rales. She exhibits no  tenderness.  Abdominal: Soft. She exhibits no distension and no mass. Bowel sounds are decreased. There is tenderness in the right upper quadrant. There is positive Murphy's sign.  Moderate TTP to RUQ. +Murphy's sign. Bowel sounds decreased.   Musculoskeletal: Normal range of motion. She exhibits no edema.  Lymphadenopathy:    She has no cervical adenopathy.  Neurological: She is alert and oriented to person, place, and time. No cranial nerve deficit. She exhibits normal muscle tone. Coordination normal.  Skin: Skin is warm and dry. No rash noted.  Psychiatric: She has a normal mood and affect. Her behavior is normal. Judgment and thought content normal.  Nursing note and vitals reviewed.    ED Treatments / Results  DIAGNOSTIC STUDIES: Oxygen Saturation is 100% on RA, normal by my interpretation.    COORDINATION OF CARE: 11:48 PM- Pt advised of plan for treatment and pt agrees.  Labs (all labs ordered are listed, but only abnormal results are displayed) Labs Reviewed  COMPREHENSIVE METABOLIC PANEL - Abnormal; Notable for the following:       Result Value   Total Protein 6.4 (*)    AST 12 (*)    ALT 10 (*)    All other components within normal limits  LIPASE, BLOOD  CBC WITH DIFFERENTIAL/PLATELET  POC URINE PREG, ED    Procedures Procedures (including critical care time)  Medications Ordered in ED Medications  sodium chloride 0.9 % bolus 1,000 mL (1,000 mLs Intravenous New Bag/Given 11/15/16 2359)  morphine 4 MG/ML injection 4 mg (4 mg Intravenous Given 11/15/16 2359)  ondansetron (ZOFRAN) injection 4 mg (4 mg Intravenous Given 11/15/16 2359)     Initial Impression / Assessment and Plan / ED Course  I have reviewed the triage vital signs and the nursing notes.  Pertinent labs & imaging results that were available during my care of the patient were reviewed by me and considered in my medical decision making (see chart for details).  Abdominal pain, nausea, vomiting  consistent with biliary colic. Old records are reviewed, and she had an ultrasound 6 months ago which did show sludge and small stones in the gallbladder. She had seen a surgeon who was not convinced that her pain was biliary and our urgent. History and exam today are consistent with biliary colic. She is given IV fluids, ondansetron, morphine with good relief of pain and nausea. Screening labs are unremarkable including normal WBC and normal transaminases and alkaline phosphatase and bilirubin. No indication for hospitalization or emergent surgery. She is referred back to general surgery. She is given take home packs for oxycodone have acetaminophen and ondansetron. Return precautions discussed.  Final Clinical Impressions(s) / ED Diagnoses   Final diagnoses:  Biliary colic    New Prescriptions New Prescriptions   ONDANSETRON (  ZOFRAN) 4 MG TABLET    Take 1 tablet (4 mg total) by mouth every 6 (six) hours as needed for nausea.   OXYCODONE-ACETAMINOPHEN (PERCOCET) 5-325 MG TABLET    Take 1 tablet by mouth every 4 (four) hours as needed for moderate pain.   I personally performed the services described in this documentation, which was scribed in my presence. The recorded information has been reviewed and is accurate.       Delora Fuel, MD 53/20/23 [phone]

## 2016-11-15 NOTE — ED Triage Notes (Signed)
Epigastric abd pain and nausea since last night.  Hx of gallstones

## 2016-11-16 LAB — COMPREHENSIVE METABOLIC PANEL
ALK PHOS: 64 U/L (ref 38–126)
ALT: 10 U/L — AB (ref 14–54)
ANION GAP: 7 (ref 5–15)
AST: 12 U/L — ABNORMAL LOW (ref 15–41)
Albumin: 3.9 g/dL (ref 3.5–5.0)
BILIRUBIN TOTAL: 0.5 mg/dL (ref 0.3–1.2)
BUN: 11 mg/dL (ref 6–20)
CALCIUM: 9.1 mg/dL (ref 8.9–10.3)
CO2: 24 mmol/L (ref 22–32)
CREATININE: 0.74 mg/dL (ref 0.44–1.00)
Chloride: 110 mmol/L (ref 101–111)
GFR calc non Af Amer: >60 mL/min (ref >60)
GLUCOSE: 99 mg/dL (ref 65–99)
Potassium: 3.7 mmol/L (ref 3.5–5.1)
Sodium: 141 mmol/L (ref 135–145)
TOTAL PROTEIN: 6.4 g/dL — AB (ref 6.5–8.1)

## 2016-11-16 LAB — CBC WITH DIFFERENTIAL/PLATELET
BASOS PCT: 0 %
Basophils Absolute: 0 10*3/uL (ref 0.0–0.1)
EOS ABS: 0.2 10*3/uL (ref 0.0–0.7)
EOS PCT: 3 %
HCT: 39 % (ref 36.0–46.0)
Hemoglobin: 13.2 g/dL (ref 12.0–15.0)
LYMPHS ABS: 3 10*3/uL (ref 0.7–4.0)
Lymphocytes Relative: 32 %
MCH: 30 pg (ref 26.0–34.0)
MCHC: 33.8 g/dL (ref 30.0–36.0)
MCV: 88.6 fL (ref 78.0–100.0)
MONOS PCT: 5 %
Monocytes Absolute: 0.5 10*3/uL (ref 0.1–1.0)
Neutro Abs: 5.6 10*3/uL (ref 1.7–7.7)
Neutrophils Relative %: 60 %
PLATELETS: 299 10*3/uL (ref 150–400)
RBC: 4.4 MIL/uL (ref 3.87–5.11)
RDW: 13.6 % (ref 11.5–15.5)
WBC: 9.4 10*3/uL (ref 4.0–10.5)

## 2016-11-16 LAB — POC URINE PREG, ED: PREG TEST UR: NEGATIVE

## 2016-11-16 LAB — LIPASE, BLOOD: Lipase: 29 U/L (ref 11–51)

## 2016-11-16 MED ORDER — ONDANSETRON HCL 4 MG PO TABS: 4.0000 mg | ORAL_TABLET | Freq: Four times a day (QID) | ORAL | 0 refills | Status: AC | PRN

## 2016-11-16 MED ORDER — OXYCODONE-ACETAMINOPHEN 5-325 MG PO TABS: 1.0000 | ORAL_TABLET | ORAL | 0 refills | Status: DC | PRN

## 2016-11-16 NOTE — ED Notes (Signed)
edp notified of low bp, will watch patient for 20 more minutes prior to dc

## 2016-11-16 NOTE — Discharge Instructions (Signed)
Stay on a low fat diet.  ° °Return if symptoms are getting worse. °

## 2016-11-24 ENCOUNTER — Telehealth: Payer: Self-pay

## 2016-11-24 NOTE — Telephone Encounter (Signed)
Patient was seen in the ED on 11/15/16. I tried calling the patient to make her a follow up appointment in the office. Follow-up from ED (11/15/16): Biliary Colic  I left her a voicemail to return my call. I will attempt to make this appointment again at a later time.

## 2016-11-24 NOTE — Telephone Encounter (Signed)
Patient has been scheduled for 11/26/2016

## 2016-11-26 ENCOUNTER — Ambulatory Visit: Payer: No Typology Code available for payment source | Admitting: Surgery

## 2016-11-26 ENCOUNTER — Encounter: Payer: Self-pay | Admitting: Surgery

## 2016-11-26 VITALS — BP 91/54 | HR 72 | Temp 98.0°F | Ht 66.0 in | Wt 150.0 lb

## 2016-11-26 DIAGNOSIS — K8020 Calculus of gallbladder without cholecystitis without obstruction: Secondary | ICD-10-CM

## 2016-11-26 NOTE — Progress Notes (Unsigned)
Surgical Consultation  11/26/2016  Tammy Johnson is an 36 y.o. female.   CC:abd pain  HPI: This patient with abdominal pain. She had an ultrasound several years ago showing gallstones. She saw Dr. Adonis Huguenin 2 years ago at which time she was having severe diarrhea and was believed to have an enteritis as opposed to gallstones causing her problem. Subsequent to that she has experienced every 2 or 3 weeks right upper quadrant pain with nausea no vomiting and occasional explosive diarrhea but always right upper quadrant pain.  Patient has strong family history of gallbladder disease  She also has a history of breast cancer treated with chemotherapy no radiation therapy at [facility]  She is studying respiratory therapy but continues to smoke.  Past Medical History:  Diagnosis Date  . Abdominal pain 06/27/2015  . Asthma   . Calculus of kidney 10/18/2013  . Cancer [facility]) 2005 and 2008   breast right side   . 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 Intractable migraine without aura 01/11/2014  . Malignant neoplasm of breast ([place]) 10/18/2013  . Migraine   . Migraines 11/13/2013  . TB lung, latent 2014   + TB Skin test; Prophalactic Rofampin    Past Surgical History:  Procedure Laterality Date  . BREAST SURGERY Right 2005   Lumpectomy  . LAPAROSCOPIC ENDOMETRIOSIS FULGURATION  1999  . RT BREAST MASS      Family History  Problem Relation Age of Onset  . Cervical cancer Maternal Grandmother   . Heart disease Maternal Grandmother   . Cancer - Colon Maternal Grandmother   . Cancer Mother        breast   . Depression Brother   . Bipolar disorder Brother   . Schizophrenia Brother     Social History:  reports that she quit smoking about 2 years ago. She has never used smokeless tobacco. She reports that she drinks alcohol. She reports that she does not use drugs.  Allergies:  Allergies  Allergen Reactions  . Bee  Venom Anaphylaxis  . Penicillins Anaphylaxis    Has patient had a PCN reaction causing immediate rash, facial/tongue/throat swelling, SOB or lightheadedness with hypotension: {Yes} Has patient had a PCN reaction causing severe rash involving mucus membranes or skin necrosis: {Yes} Has patient had a PCN reaction that required hospitalization {Yes} Has patient had a PCN reaction occurring within the last 10 years: {Yes} If all of the above answers are "NO", then may proceed with Cephalosporin use.   . Dapsone Hives, Itching and Rash  . Clindamycin/Lincomycin Hives  . Latex Swelling    Medications reviewed.   Review of Systems:   Review of Systems  Constitutional: Negative.   HENT: Negative.   Eyes: Negative.   Respiratory: Negative.   Cardiovascular: Negative.   Gastrointestinal: Positive for abdominal pain, diarrhea and nausea. Negative for blood in stool, constipation, heartburn and vomiting.  Genitourinary: Negative.   Musculoskeletal: Negative.   Skin: Negative.   Neurological: Negative.   Endo/Heme/Allergies: Negative.   Psychiatric/Behavioral: Negative.      Physical Exam:  LMP 10/28/2016 (Approximate)   Physical Exam  Constitutional: She is oriented to person, place, and time and well-developed, well-nourished, and in no distress. No distress.  HENT:  Head: Normocephalic and atraumatic.  Eyes: Pupils are equal, round, and reactive to light. Right eye exhibits no discharge. Left eye exhibits no discharge. No scleral icterus.  Neck: Normal range of motion.  Cardiovascular: Normal rate, regular rhythm and normal heart sounds.   Pulmonary/Chest: Effort normal and breath sounds normal. No respiratory distress. She has no wheezes. She has no rales.  Abdominal: Soft. She exhibits no distension. There is no tenderness. There is no rebound and no guarding.  Small nontender nonerythematous umbilical hernia  Musculoskeletal: Normal range of motion. She exhibits no edema.   Lymphadenopathy:    She has no cervical adenopathy.  Neurological: She is alert and oriented to person, place, and time.  Skin: Skin is warm and dry. No rash noted. She is not diaphoretic. No erythema.  Psychiatric: Mood and affect normal.  Vitals reviewed.     No results found for this or any previous visit (from the past 48 hour(s)). No results found.  Assessment/Plan:  Patient had normal liver function tests recently plan would be for laparoscopic cholecystectomy for control of her symptoms the options of observation reviewed the risks of bleeding infection failure to resolve her symptoms and recurrent symptoms were all reviewed with her the risks of bile duct damage bile duct leak or bowel injury were reviewed. She also would require umbilical hernia repair but would not likely require mesh for this as it is quite small. I discussed the risk of recurrence cosmetic deformity she understood and agreed to proceed. 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 We will try to accommodate dose.  Florene Glen, MD, FACS

## 2016-11-26 NOTE — Patient Instructions (Signed)
We have scheduled your LaparoscopicCholecystectomy procudure with Dr. Burt Knack for 12/25/2016.   Please see your blue sheet with further instruction.

## 2016-11-27 ENCOUNTER — Telehealth: Payer: Self-pay | Admitting: Surgery

## 2016-11-27 NOTE — Telephone Encounter (Signed)
Pt advised of pre op date/time and sx date. Sx: 12/25/16 with Dr Maeola Sarah cholecystectomy.  Pre op: 12/17/16 between 9-1:00pm.Phone  Patient made aware to call [phone], between 1-3:00pm the day before surgery, to find out what time to arrive.

## 2016-12-17 ENCOUNTER — Encounter
Admission: RE | Admit: 2016-12-17 | Discharge: 2016-12-17 | Disposition: A | Discharge status: Home / Self Care | Payer: Commercial Managed Care - PPO | Source: Ambulatory Visit | Attending: Surgery | Admitting: Surgery

## 2016-12-17 NOTE — Patient Instructions (Signed)
  Your procedure is scheduled on: 12/25/16 Report to Same Day Surgery 2nd floor medical mall [facility] Entrance-take elevator on left to 2nd floor.  Check in with surgery information desk.) To find out your arrival time please call [phone] between 1PM - 3PM on 12/24/16  Remember: Instructions that are not followed completely may result in serious medical risk, up to and including death, or upon the discretion of your surgeon and anesthesiologist your surgery may need to be rescheduled.    _x___ 1. Do not eat food or drink liquids after midnight. No gum chewing or                              hard candies.     __x__ 2. No Alcohol for 24 hours before or after surgery.   __x__3. No Smoking for 24 prior to surgery.   ____  4. Bring all medications with you on the day of surgery if instructed.    __x__ 5. Notify your doctor if there is any change in your medical condition     (cold, fever, infections).     Do not wear jewelry, make-up, hairpins, clips or nail polish.  Do not wear lotions, powders, or perfumes. You may wear deodorant.  Do not shave 48 hours prior to surgery. Men may shave face and neck.  Do not bring valuables to the hospital.    [facility] is not responsible for any belongings or valuables.               Contacts, dentures or bridgework may not be worn into surgery.  Leave your suitcase in the car. After surgery it may be brought to your room.  For patients admitted to the hospital, discharge time is determined by your                       treatment team.   Patients discharged the day of surgery will not be allowed to drive home.  You will need someone to drive you home and stay with you the night of your procedure.    Please read over the following fact sheets that you were given:   [facility] Preparing for Surgery and or MRSA Information   ____ Take anti-hypertensive (unless it includes a diuretic), cardiac, seizure, asthma,     anti-reflux and psychiatric  medicines. These include:  1.   2.  3.  4.  5.  6.  ____Fleets enema or Magnesium Citrate as directed.   _x___ Use CHG Soap or sage wipes as directed on instruction sheet   ____ Use inhalers on the day of surgery and bring to hospital day of surgery  ____ Stop Metformin and Janumet 2 days prior to surgery.    ____ Take 1/2 of usual insulin dose the night before surgery and none on the morning     surgery.   ____ Follow recommendations from Cardiologist, Pulmonologist or PCP regarding  stopping Aspirin, Coumadin, Pllavix ,Eliquis, Effient, or Pradaxa, and Pletal.  ____Stop Anti-inflammatories such as Advil, Aleve, Ibuprofen, Motrin, Naproxen, Naprosyn, Goodies powders or aspirin products. OK to take Tylenol and  Celebrex.   ____ Stop supplements until after surgery.  But may continue Vitamin D, Vitamin B,       and multivitamin.   ____ Bring C-Pap to the hospital.

## 2016-12-18 ENCOUNTER — Encounter
Admission: RE | Admit: 2016-12-18 | Discharge: 2016-12-18 | Disposition: A | Discharge status: Home / Self Care | Payer: No Typology Code available for payment source | Source: Ambulatory Visit | Attending: Surgery | Admitting: Surgery

## 2016-12-18 DIAGNOSIS — Z888 Allergy status to other drugs, medicaments and biological substances status: Secondary | ICD-10-CM | POA: Diagnosis not present

## 2016-12-18 DIAGNOSIS — Z853 Personal history of malignant neoplasm of breast: Secondary | ICD-10-CM | POA: Diagnosis not present

## 2016-12-18 DIAGNOSIS — Z9221 Personal history of antineoplastic chemotherapy: Secondary | ICD-10-CM | POA: Insufficient documentation

## 2016-12-18 DIAGNOSIS — K8080 Other cholelithiasis without obstruction: Secondary | ICD-10-CM | POA: Insufficient documentation

## 2016-12-18 DIAGNOSIS — Z01812 Encounter for preprocedural laboratory examination: Secondary | ICD-10-CM | POA: Diagnosis present

## 2016-12-18 DIAGNOSIS — R109 Unspecified abdominal pain: Secondary | ICD-10-CM | POA: Insufficient documentation

## 2016-12-18 DIAGNOSIS — Z9889 Other specified postprocedural states: Secondary | ICD-10-CM | POA: Diagnosis not present

## 2016-12-18 LAB — CBC WITH DIFFERENTIAL/PLATELET
BASOS ABS: 0.1 10*3/uL (ref 0–0.1)
BASOS PCT: 1 %
Eosinophils Absolute: 0.1 10*3/uL (ref 0–0.7)
Eosinophils Relative: 2 %
HEMATOCRIT: 40 % (ref 35.0–47.0)
HEMOGLOBIN: 13.5 g/dL (ref 12.0–16.0)
Lymphocytes Relative: 20 %
Lymphs Abs: 1.8 10*3/uL (ref 1.0–3.6)
MCH: 29.8 pg (ref 26.0–34.0)
MCHC: 33.7 g/dL (ref 32.0–36.0)
MCV: 88.4 fL (ref 80.0–100.0)
Monocytes Absolute: 0.4 10*3/uL (ref 0.2–0.9)
Monocytes Relative: 5 %
NEUTROS PCT: 72 %
Neutro Abs: 6.8 10*3/uL — ABNORMAL HIGH (ref 1.4–6.5)
Platelets: 309 10*3/uL (ref 150–440)
RBC: 4.53 MIL/uL (ref 3.80–5.20)
RDW: 13.6 % (ref 11.5–14.5)
WBC: 9.2 10*3/uL (ref 3.6–11.0)

## 2016-12-18 LAB — COMPREHENSIVE METABOLIC PANEL
ALK PHOS: 82 U/L (ref 38–126)
ALT: 12 U/L — ABNORMAL LOW (ref 14–54)
AST: 20 U/L (ref 15–41)
Albumin: 4.1 g/dL (ref 3.5–5.0)
Anion gap: 8 (ref 5–15)
BILIRUBIN TOTAL: 0.6 mg/dL (ref 0.3–1.2)
BUN: 10 mg/dL (ref 6–20)
CALCIUM: 8.7 mg/dL — AB (ref 8.9–10.3)
CO2: 23 mmol/L (ref 22–32)
Chloride: 106 mmol/L (ref 101–111)
Creatinine, Ser: 0.7 mg/dL (ref 0.44–1.00)
GFR calc Af Amer: >60 mL/min (ref >60)
GFR calc non Af Amer: >60 mL/min (ref >60)
Glucose, Bld: 115 mg/dL — ABNORMAL HIGH (ref 65–99)
Potassium: 3.3 mmol/L — ABNORMAL LOW (ref 3.5–5.1)
Sodium: 137 mmol/L (ref 135–145)
TOTAL PROTEIN: 6.8 g/dL (ref 6.5–8.1)

## 2016-12-22 ENCOUNTER — Telehealth: Payer: Self-pay

## 2016-12-22 MED ORDER — POTASSIUM CHLORIDE 20 MEQ PO PACK: 20.0000 meq | PACK | Freq: Two times a day (BID) | ORAL | 0 refills | Status: DC

## 2016-12-22 NOTE — Telephone Encounter (Signed)
Called patient to let her know that Pre-Admit called [place] this morning to let us know that her Potassium was low and therefore she would need to start taking Potassium twice a day for 5 days. Patient understood and had no questions.

## 2016-12-22 NOTE — Pre-Procedure Instructions (Signed)
KT 3.3 CALLED TO [place] AT [facility]

## 2016-12-24 MED ORDER — CIPROFLOXACIN IN D5W 400 MG/200ML IV SOLN
400.0000 mg | INTRAVENOUS | Status: AC
  Administered 2016-12-25: 400 mg via INTRAVENOUS

## 2016-12-25 ENCOUNTER — Ambulatory Visit: Payer: No Typology Code available for payment source | Admitting: Anesthesiology

## 2016-12-25 ENCOUNTER — Encounter
Admission: RE | Disposition: A | Discharge status: Home / Self Care | Payer: Self-pay | Source: Ambulatory Visit | Attending: Surgery

## 2016-12-25 ENCOUNTER — Ambulatory Visit
Admission: RE | Admit: 2016-12-25 | Discharge: 2016-12-25 | Disposition: A | Discharge status: Home / Self Care | Payer: No Typology Code available for payment source | Source: Ambulatory Visit | Attending: Surgery | Admitting: Surgery

## 2016-12-25 ENCOUNTER — Encounter: Payer: Self-pay | Admitting: *Deleted

## 2016-12-25 DIAGNOSIS — Z7982 Long term (current) use of aspirin: Secondary | ICD-10-CM | POA: Insufficient documentation

## 2016-12-25 DIAGNOSIS — K8010 Calculus of gallbladder with chronic cholecystitis without obstruction: Secondary | ICD-10-CM | POA: Diagnosis not present

## 2016-12-25 DIAGNOSIS — Z79899 Other long term (current) drug therapy: Secondary | ICD-10-CM | POA: Insufficient documentation

## 2016-12-25 DIAGNOSIS — Z8611 Personal history of tuberculosis: Secondary | ICD-10-CM | POA: Insufficient documentation

## 2016-12-25 DIAGNOSIS — K811 Chronic cholecystitis: Secondary | ICD-10-CM | POA: Insufficient documentation

## 2016-12-25 DIAGNOSIS — K429 Umbilical hernia without obstruction or gangrene: Secondary | ICD-10-CM | POA: Diagnosis not present

## 2016-12-25 DIAGNOSIS — R51 Headache: Secondary | ICD-10-CM | POA: Insufficient documentation

## 2016-12-25 DIAGNOSIS — Z853 Personal history of malignant neoplasm of breast: Secondary | ICD-10-CM | POA: Insufficient documentation

## 2016-12-25 HISTORY — PX: CHOLECYSTECTOMY: SHX55

## 2016-12-25 HISTORY — PX: UMBILICAL HERNIA REPAIR: SHX196

## 2016-12-25 LAB — POCT I-STAT 4, (NA,K, GLUC, HGB,HCT)
Glucose, Bld: 88 mg/dL (ref 65–99)
HCT: 38 % (ref 36.0–46.0)
Hemoglobin: 12.9 g/dL (ref 12.0–15.0)
POTASSIUM: 3.9 mmol/L (ref 3.5–5.1)
Sodium: 138 mmol/L (ref 135–145)

## 2016-12-25 LAB — POCT PREGNANCY, URINE: PREG TEST UR: NEGATIVE

## 2016-12-25 SURGERY — LAPAROSCOPIC CHOLECYSTECTOMY
Anesthesia: General

## 2016-12-25 MED ORDER — ONDANSETRON HCL 4 MG/2ML IJ SOLN
INTRAMUSCULAR | Status: AC
  Filled 2016-12-25: qty 2

## 2016-12-25 MED ORDER — FAMOTIDINE 20 MG PO TABS
20.0000 mg | ORAL_TABLET | Freq: Once | ORAL | Status: AC
  Administered 2016-12-25: 20 mg via ORAL

## 2016-12-25 MED ORDER — CHLORHEXIDINE GLUCONATE CLOTH 2 % EX PADS
6.0000 | MEDICATED_PAD | Freq: Once | CUTANEOUS | Status: AC
  Administered 2016-12-25: 6 via TOPICAL

## 2016-12-25 MED ORDER — ROCURONIUM BROMIDE 50 MG/5ML IV SOLN
INTRAVENOUS | Status: AC
  Filled 2016-12-25: qty 1

## 2016-12-25 MED ORDER — OXYCODONE-ACETAMINOPHEN 5-325 MG PO TABS: 1.0000 | ORAL_TABLET | ORAL | 0 refills | Status: DC | PRN

## 2016-12-25 MED ORDER — OXYCODONE-ACETAMINOPHEN 5-325 MG PO TABS
1.0000 | ORAL_TABLET | ORAL | Status: DC | PRN
  Administered 2016-12-25: 1 via ORAL

## 2016-12-25 MED ORDER — SUGAMMADEX SODIUM 200 MG/2ML IV SOLN
INTRAVENOUS | Status: AC
  Filled 2016-12-25: qty 2

## 2016-12-25 MED ORDER — PROPOFOL 10 MG/ML IV BOLUS
INTRAVENOUS | Status: AC
  Filled 2016-12-25: qty 20

## 2016-12-25 MED ORDER — PHENYLEPHRINE HCL 10 MG/ML IJ SOLN
INTRAMUSCULAR | Status: AC
  Filled 2016-12-25: qty 1

## 2016-12-25 MED ORDER — FENTANYL CITRATE (PF) 100 MCG/2ML IJ SOLN
INTRAMUSCULAR | Status: AC
  Filled 2016-12-25: qty 2

## 2016-12-25 MED ORDER — FENTANYL CITRATE (PF) 100 MCG/2ML IJ SOLN
INTRAMUSCULAR | Status: DC | PRN
  Administered 2016-12-25: 50 ug via INTRAVENOUS
  Administered 2016-12-25: 25 ug via INTRAVENOUS
  Administered 2016-12-25: 100 ug via INTRAVENOUS
  Administered 2016-12-25: 25 ug via INTRAVENOUS

## 2016-12-25 MED ORDER — SUGAMMADEX SODIUM 200 MG/2ML IV SOLN
INTRAVENOUS | Status: DC | PRN
  Administered 2016-12-25: 150 mg via INTRAVENOUS

## 2016-12-25 MED ORDER — DEXAMETHASONE SODIUM PHOSPHATE 10 MG/ML IJ SOLN
INTRAMUSCULAR | Status: AC
  Filled 2016-12-25: qty 1

## 2016-12-25 MED ORDER — DEXAMETHASONE SODIUM PHOSPHATE 10 MG/ML IJ SOLN
INTRAMUSCULAR | Status: DC | PRN
  Administered 2016-12-25: 5 mg via INTRAVENOUS

## 2016-12-25 MED ORDER — SUCCINYLCHOLINE CHLORIDE 20 MG/ML IJ SOLN
INTRAMUSCULAR | Status: DC | PRN
  Administered 2016-12-25: 80 mg via INTRAVENOUS

## 2016-12-25 MED ORDER — CIPROFLOXACIN IN D5W 400 MG/200ML IV SOLN
INTRAVENOUS | Status: AC
  Filled 2016-12-25: qty 200

## 2016-12-25 MED ORDER — HEPARIN SODIUM (PORCINE) 5000 UNIT/ML IJ SOLN
5000.0000 [IU] | Freq: Once | INTRAMUSCULAR | Status: AC
  Administered 2016-12-25: 5000 [IU] via SUBCUTANEOUS

## 2016-12-25 MED ORDER — MIDAZOLAM HCL 2 MG/2ML IJ SOLN
INTRAMUSCULAR | Status: DC | PRN
  Administered 2016-12-25: 2 mg via INTRAVENOUS

## 2016-12-25 MED ORDER — LIDOCAINE HCL (PF) 2 % IJ SOLN
INTRAMUSCULAR | Status: AC
  Filled 2016-12-25: qty 2

## 2016-12-25 MED ORDER — BUPIVACAINE-EPINEPHRINE (PF) 0.25% -1:200000 IJ SOLN
INTRAMUSCULAR | Status: AC
  Filled 2016-12-25: qty 30

## 2016-12-25 MED ORDER — MIDAZOLAM HCL 2 MG/2ML IJ SOLN
INTRAMUSCULAR | Status: AC
  Filled 2016-12-25: qty 2

## 2016-12-25 MED ORDER — ONDANSETRON HCL 4 MG/2ML IJ SOLN
INTRAMUSCULAR | Status: DC | PRN
  Administered 2016-12-25: 4 mg via INTRAVENOUS

## 2016-12-25 MED ORDER — ROCURONIUM BROMIDE 100 MG/10ML IV SOLN
INTRAVENOUS | Status: DC | PRN
  Administered 2016-12-25: 5 mg via INTRAVENOUS
  Administered 2016-12-25: 35 mg via INTRAVENOUS

## 2016-12-25 MED ORDER — LIDOCAINE HCL (CARDIAC) 20 MG/ML IV SOLN
INTRAVENOUS | Status: DC | PRN
  Administered 2016-12-25: 60 mg via INTRAVENOUS

## 2016-12-25 MED ORDER — FAMOTIDINE 20 MG PO TABS
ORAL_TABLET | ORAL | Status: AC
  Administered 2016-12-25: 20 mg via ORAL
  Filled 2016-12-25: qty 1

## 2016-12-25 MED ORDER — BUPIVACAINE-EPINEPHRINE (PF) 0.25% -1:200000 IJ SOLN
INTRAMUSCULAR | Status: DC | PRN
  Administered 2016-12-25: 30 mL

## 2016-12-25 MED ORDER — ACETAMINOPHEN 10 MG/ML IV SOLN
INTRAVENOUS | Status: AC
  Filled 2016-12-25: qty 100

## 2016-12-25 MED ORDER — SUCCINYLCHOLINE CHLORIDE 20 MG/ML IJ SOLN
INTRAMUSCULAR | Status: AC
  Filled 2016-12-25: qty 1

## 2016-12-25 MED ORDER — PROPOFOL 10 MG/ML IV BOLUS
INTRAVENOUS | Status: DC | PRN
  Administered 2016-12-25: 150 mg via INTRAVENOUS

## 2016-12-25 MED ORDER — FENTANYL CITRATE (PF) 100 MCG/2ML IJ SOLN: 25.0000 ug | INTRAMUSCULAR | Status: DC | PRN

## 2016-12-25 MED ORDER — KETOROLAC TROMETHAMINE 30 MG/ML IJ SOLN
INTRAMUSCULAR | Status: DC | PRN
  Administered 2016-12-25: 30 mg via INTRAVENOUS

## 2016-12-25 MED ORDER — LACTATED RINGERS IV SOLN
INTRAVENOUS | Status: DC
  Administered 2016-12-25: 09:00:00 via INTRAVENOUS

## 2016-12-25 MED ORDER — HEPARIN SODIUM (PORCINE) 5000 UNIT/ML IJ SOLN
INTRAMUSCULAR | Status: AC
  Administered 2016-12-25: 5000 [IU] via SUBCUTANEOUS
  Filled 2016-12-25: qty 1

## 2016-12-25 MED ORDER — ONDANSETRON HCL 4 MG/2ML IJ SOLN: 4.0000 mg | Freq: Once | INTRAMUSCULAR | Status: DC | PRN

## 2016-12-25 MED ORDER — ACETAMINOPHEN 10 MG/ML IV SOLN
INTRAVENOUS | Status: DC | PRN
  Administered 2016-12-25: 1000 mg via INTRAVENOUS

## 2016-12-25 MED ORDER — PHENYLEPHRINE HCL 10 MG/ML IJ SOLN
INTRAMUSCULAR | Status: DC | PRN
  Administered 2016-12-25: 50 ug via INTRAVENOUS

## 2016-12-25 MED ORDER — OXYCODONE-ACETAMINOPHEN 5-325 MG PO TABS
ORAL_TABLET | ORAL | Status: AC
  Filled 2016-12-25: qty 1

## 2016-12-25 SURGICAL SUPPLY — 43 items
ADHESIVE MASTISOL STRL (MISCELLANEOUS) ×4 IMPLANT
APPLIER CLIP ROT 10 11.4 M/L (STAPLE) ×4
BLADE SURG SZ11 CARB STEEL (BLADE) ×4 IMPLANT
CANISTER SUCT 1200ML W/VALVE (MISCELLANEOUS) ×4 IMPLANT
CATH CHOLANGI 4FR 420404F (CATHETERS) IMPLANT
CHLORAPREP W/TINT 26ML (MISCELLANEOUS) ×4 IMPLANT
CLIP APPLIE ROT 10 11.4 M/L (STAPLE) ×2 IMPLANT
CLOSURE WOUND 1/2 X4 (GAUZE/BANDAGES/DRESSINGS) ×1
CONRAY 60ML FOR OR (MISCELLANEOUS) IMPLANT
DRAPE C-ARM XRAY 36X54 (DRAPES) IMPLANT
ELECT REM PT RETURN 9FT ADLT (ELECTROSURGICAL) ×4
ELECTRODE REM PT RTRN 9FT ADLT (ELECTROSURGICAL) ×2 IMPLANT
ENDOPOUCH RETRIEVER 10 (MISCELLANEOUS) ×4 IMPLANT
GAUZE SPONGE NON-WVN 2X2 STRL (MISCELLANEOUS) ×8 IMPLANT
GLOVE BIO SURGEON STRL SZ8 (GLOVE) ×4 IMPLANT
GOWN STRL REUS W/ TWL LRG LVL3 (GOWN DISPOSABLE) ×8 IMPLANT
GOWN STRL REUS W/TWL LRG LVL3 (GOWN DISPOSABLE) ×8
IRRIGATION STRYKERFLOW (MISCELLANEOUS) IMPLANT
IRRIGATOR STRYKERFLOW (MISCELLANEOUS)
IV CATH ANGIO 12GX3 LT BLUE (NEEDLE) ×4 IMPLANT
IV NS 1000ML (IV SOLUTION)
IV NS 1000ML BAXH (IV SOLUTION) IMPLANT
JACKSON PRATT 10 (INSTRUMENTS) IMPLANT
KIT RM TURNOVER STRD PROC AR (KITS) ×4 IMPLANT
LABEL OR SOLS (LABEL) ×4 IMPLANT
NDL SAFETY 22GX1.5 (NEEDLE) ×4 IMPLANT
NEEDLE VERESS 14GA 120MM (NEEDLE) ×4 IMPLANT
NS IRRIG 500ML POUR BTL (IV SOLUTION) ×4 IMPLANT
PACK LAP CHOLECYSTECTOMY (MISCELLANEOUS) ×4 IMPLANT
SCISSORS METZENBAUM CVD 33 (INSTRUMENTS) ×4 IMPLANT
SLEEVE ENDOPATH XCEL 5M (ENDOMECHANICALS) ×8 IMPLANT
SPONGE LAP 18X18 5 PK (GAUZE/BANDAGES/DRESSINGS) ×4 IMPLANT
SPONGE VERSALON 2X2 STRL (MISCELLANEOUS) ×8
SPONGE VERSALON 4X4 4PLY (MISCELLANEOUS) IMPLANT
STRIP CLOSURE SKIN 1/2X4 (GAUZE/BANDAGES/DRESSINGS) ×3 IMPLANT
SUT MNCRL 4-0 (SUTURE) ×2
SUT MNCRL 4-0 27XMFL (SUTURE) ×2
SUT VICRYL 0 AB UR-6 (SUTURE) ×4 IMPLANT
SUTURE MNCRL 4-0 27XMF (SUTURE) ×2 IMPLANT
SYR 20CC LL (SYRINGE) ×4 IMPLANT
TROCAR XCEL NON-BLD 11X100MML (ENDOMECHANICALS) ×4 IMPLANT
TROCAR XCEL NON-BLD 5MMX100MML (ENDOMECHANICALS) ×4 IMPLANT
TUBING INSUFFLATOR HI FLOW (MISCELLANEOUS) ×4 IMPLANT

## 2016-12-25 NOTE — Anesthesia Procedure Notes (Signed)
Procedure Name: Intubation Date/Time: 12/25/2016 9:09 AM Performed by: Hedda Slade Pre-anesthesia Checklist: Patient identified, Patient being monitored, Timeout performed, Emergency Drugs available and Suction available Patient Re-evaluated:Patient Re-evaluated prior to inductionOxygen Delivery Method: Circle system utilized Preoxygenation: Pre-oxygenation with 100% oxygen Intubation Type: IV induction Ventilation: Mask ventilation without difficulty Laryngoscope Size: Mac and 3 Grade View: Grade II Tube type: Oral Tube size: 7.0 mm Number of attempts: 1 Airway Equipment and Method: Stylet Placement Confirmation: ETT inserted through vocal cords under direct vision,  positive ETCO2 and breath sounds checked- equal and bilateral Secured at: 21 cm Tube secured with: Tape Dental Injury: Teeth and Oropharynx as per pre-operative assessment

## 2016-12-25 NOTE — Anesthesia Post-op Follow-up Note (Cosign Needed)
Anesthesia QCDR form completed.        

## 2016-12-25 NOTE — Op Note (Signed)
Laparoscopic Cholecystectomy  Pre-operative Diagnosis: Biliary colic  Post-operative Diagnosis: Biliary colic  Procedure: Laparoscopic cholecystectomy, umbilical hernia repair  Surgeon: Jerrol Banana. Burt Knack, MD FACS  Anesthesia: Gen. with endotracheal tube  Assistant: Surgical tech  Procedure Details  The patient was seen again in the Holding Room. The benefits, complications, treatment options, and expected outcomes were discussed with the patient. The risks of bleeding, infection, recurrence of symptoms, failure to resolve symptoms, bile duct damage, bile duct leak, retained common bile duct stone, bowel injury, any of which could require further surgery and/or ERCP, stent, or papillotomy were reviewed with the patient. The likelihood of improving the patient's symptoms with return to their baseline status is good.  The patient also was counseled concerning the risk of not improving or even worsening her diarrhea. Patient also has an umbilical hernia which will be repaired at the same time this been discussed in the office and we reviewed the options and the risks of bleeding infection recurrence the inability to use mesh and the risk of cosmetic deformity this was emphasized for her the preop holding area. The patient and/or family concurred with the proposed plan, giving informed consent.  The patient was taken to Operating Room, identified as Tammy Johnson and the procedure verified as Laparoscopic Cholecystectomy and umbilical hernia repair  A Time Out was held and the above information confirmed.  Prior to the induction of general anesthesia, antibiotic prophylaxis was administered. VTE prophylaxis was in place. General endotracheal anesthesia was then administered and tolerated well. After the induction, the abdomen was prepped with Chloraprep and draped in the sterile fashion. The patient was positioned in the supine position.  Local anesthetic  was injected into the skin near the  umbilicus and an incision made. This incision was in the infraumbilical area. The skin was elevated off of the fascia and the portion of incarcerated omentum was excised with electrocautery and passed off for examination. The fascial edges were cleaned and through a very small umbilical hernia was placed a blunt trocar sheath. Pneumoperitoneum was then created with CO2 and tolerated well without any adverse changes in the patient's vital signsTthe abdominal cavity was explored.  Two 5-mm ports were placed in the right upper quadrant and a 12 mm epigastric port was placed all under direct vision. All skin incisions  were infiltrated with a local anesthetic agent before making the incision and placing the trocars.   The patient was positioned  in reverse Trendelenburg, tilted slightly to the patient's left.  The gallbladder was identified, the fundus grasped and retracted cephalad. There were adhesions from the liver to the diaphragm which were not lysed. Other adhesions to the gallbladder were lysed. The infundibulum was grasped and retracted laterally, exposing the peritoneum overlying the triangle of Calot. This was then divided and exposed in a blunt fashion. A critical view of the cystic duct and cystic artery was obtained.  The cystic duct was clearly identified and bluntly dissected.   The cystic artery lymphatics were doubly clipped and divided this allowed for good visualization of the cystic duct as it entered the infundibulum of the gallbladder. The cystic duct was then doubly clipped and divided.  The gallbladder was taken from the gallbladder fossa in a retrograde fashion with the electrocautery. The gallbladder was removed and placed in an Endocatch bag. The liver bed was irrigated and inspected. Hemostasis was achieved with the electrocautery. Copious irrigation was utilized and was repeatedly aspirated until clear.  The gallbladder and Endocatch sac were  then removed through the epigastric port  site.   Inspection of the right upper quadrant was performed. No bleeding, bile duct injury or leak, or bowel injury was noted.  Attention was returned to the periumbilical area where the trocar sheath was removed. The umbilical hernia repair which was quite small was repaired with multiple figure-of-eight 0 Ethibonds. The camera had been placed in the epigastric site and viewed back in the periumbilical site. The repair appeared adequate and there was no sign of bowel injury or bleeding.   Pneumoperitoneum was released.  The epigastric port site was closed with figure-of-eight 0 Vicryl sutures. 4-0 subcuticular Monocryl was used to close the skin. 3-0 Vicryl deep sutures were utilized to tack the skin of the umbilicus back to the fascia and further deep sutures of 3-0 Vicryl were placed followed by 4-0 subcuticular Monocryl. Steristrips and Mastisol and sterile dressings were  applied.  The patient was then extubated and brought to the recovery room in stable condition. Sponge, lap, and needle counts were correct at closure and at the conclusion of the case.   Findings: Chronic Cholecystitis and small umbilical hernia.  Estimated Blood Loss: Minimal         Drains: None         Specimens: Gallbladder   and omental resection.        Complications: none               Tammy Johnson E. Burt Knack, MD, FACS

## 2016-12-25 NOTE — Transfer of Care (Signed)
Immediate Anesthesia Transfer of Care Note  Patient: Tammy Johnson  Procedure(s) Performed: Procedure(s): LAPAROSCOPIC CHOLECYSTECTOMY (N/A) HERNIA REPAIR UMBILICAL ADULT  Patient Location: PACU  Anesthesia Type:General  Level of Consciousness: awake, alert  and oriented  Airway & Oxygen Therapy: Patient Spontanous Breathing and Patient connected to face mask oxygen  Post-op Assessment: Report given to RN and Post -op Vital signs reviewed and stable  Post vital signs: Reviewed and unstable  Last Vitals:  Vitals:   12/25/16 0851 12/25/16 1008  BP: (!) 101/32 108/63  Pulse: 64 76  Resp: 16 16  Temp: 36.7 C (!) 36 C    Last Pain:  Vitals:   12/25/16 1008  TempSrc: Temporal         Complications: No apparent anesthesia complications

## 2016-12-25 NOTE — Progress Notes (Signed)
Preoperative Review   Patient is met in the preoperative holding area. The history is reviewed in the chart and with the patient. I personally reviewed the options and rationale as well as the risks of this procedure that have been previously discussed with the patient. The patient also has an umbilical hernia in addition to her gallbladder problems. She was of the understanding as was I that she would have the umbilical hernia repaired primarily at the same time of this surgery. I had not place this on the consent form but I asked the RN to adequate. I also discussed with her in specific relation to the umbilical hernia the risk of cosmetic problems bleeding infection and recurrence and the inability to use mesh in this situation area it is a small umbilical hernia however and should not require mesh. The risk of mesh infection was reviewed. I also discussed with her her considerable diarrhea as one of her symptoms and that this operation may make her diarrhea worse and probably will not make it any better. She was of that understanding as well. All questions asked by the patient and/or family were answered to their satisfaction.  Patient agrees to proceed with this procedure at this time.  Florene Glen M.D. FACS

## 2016-12-25 NOTE — Discharge Instructions (Signed)
Remove dressing in 24 hours. °May shower in 24 hours. °Leave paper strips in place. °Resume all home medications. °Follow-up with Dr. Minda Faas in 10 days. °

## 2016-12-25 NOTE — Progress Notes (Signed)
States feeling better since pain pill   Splinting when coughing

## 2016-12-25 NOTE — Anesthesia Preprocedure Evaluation (Addendum)
Anesthesia Evaluation  Patient identified by MRN, date of birth, ID band Patient awake    Reviewed: Allergy & Precautions, NPO status , Patient's Chart, lab work & pertinent test results  Airway Mallampati: III  TM Distance: <3 FB     Dental no notable dental hx. 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* 10/18/2013: Inactive tuberculosis of lung 01/11/2014: Intractable migraine without aura 10/18/2013: Malignant neoplasm of breast ([place]) No date: Migraine 11/13/2013: Migraines 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, latent     Comment: + TB Skin test; Prophalactic Rofampin  Reproductive/Obstetrics                            Anesthesia Physical Anesthesia Plan  ASA: II  Anesthesia Plan: General   Post-op Pain Management:    Induction: Intravenous  PONV Risk Score and Plan: 4 or greater and Ondansetron, Dexamethasone, Propofol, Midazolam, Scopolamine patch - Pre-op and Treatment may vary due to age or medical condition  Airway Management Planned: Oral ETT  Additional Equipment:   Intra-op Plan:   Post-operative Plan: Extubation in OR  Informed Consent: I have reviewed the patients History and Physical, chart, labs and discussed the procedure including the risks, benefits and alternatives for the proposed anesthesia with the patient or authorized representative who has indicated his/her understanding  and acceptance.   Dental advisory given  Plan Discussed with: CRNA and Surgeon  Anesthesia Plan Comments:         Anesthesia Quick Evaluation

## 2016-12-26 ENCOUNTER — Telehealth: Payer: Self-pay

## 2016-12-26 LAB — SURGICAL PATHOLOGY

## 2016-12-26 NOTE — Telephone Encounter (Signed)
Patient had a Laparoscopic Cholecystectomy on 12/25/16 with Dr. Burt Knack. She has an exam at her school on Monday and is needing a return to school note in order to go back. I went ahead and typed the letter. Laurence Aly, her husband, will be picking up the letter today before 3:00 PM.

## 2016-12-27 ENCOUNTER — Encounter: Payer: Self-pay | Admitting: Emergency Medicine

## 2016-12-27 DIAGNOSIS — Z91040 Latex allergy status: Secondary | ICD-10-CM | POA: Insufficient documentation

## 2016-12-27 DIAGNOSIS — Z87891 Personal history of nicotine dependence: Secondary | ICD-10-CM | POA: Diagnosis not present

## 2016-12-27 DIAGNOSIS — Z79899 Other long term (current) drug therapy: Secondary | ICD-10-CM | POA: Insufficient documentation

## 2016-12-27 DIAGNOSIS — R1084 Generalized abdominal pain: Secondary | ICD-10-CM | POA: Diagnosis not present

## 2016-12-27 DIAGNOSIS — J45909 Unspecified asthma, uncomplicated: Secondary | ICD-10-CM | POA: Insufficient documentation

## 2016-12-27 DIAGNOSIS — R509 Fever, unspecified: Secondary | ICD-10-CM | POA: Diagnosis present

## 2016-12-27 LAB — COMPREHENSIVE METABOLIC PANEL
ALT: 818 U/L — ABNORMAL HIGH (ref 14–54)
AST: 517 U/L — ABNORMAL HIGH (ref 15–41)
Albumin: 3.9 g/dL (ref 3.5–5.0)
Alkaline Phosphatase: 162 U/L — ABNORMAL HIGH (ref 38–126)
Anion gap: 6 (ref 5–15)
BUN: 10 mg/dL (ref 6–20)
CHLORIDE: 104 mmol/L (ref 101–111)
CO2: 28 mmol/L (ref 22–32)
Calcium: 8.9 mg/dL (ref 8.9–10.3)
Creatinine, Ser: 0.84 mg/dL (ref 0.44–1.00)
Glucose, Bld: 95 mg/dL (ref 65–99)
POTASSIUM: 3.9 mmol/L (ref 3.5–5.1)
SODIUM: 138 mmol/L (ref 135–145)
Total Bilirubin: 0.6 mg/dL (ref 0.3–1.2)
Total Protein: 6.9 g/dL (ref 6.5–8.1)

## 2016-12-27 LAB — URINALYSIS, COMPLETE (UACMP) WITH MICROSCOPIC
Bilirubin Urine: NEGATIVE
Glucose, UA: NEGATIVE mg/dL
Hgb urine dipstick: NEGATIVE
KETONES UR: NEGATIVE mg/dL
LEUKOCYTES UA: NEGATIVE
Nitrite: NEGATIVE
PROTEIN: NEGATIVE mg/dL
Specific Gravity, Urine: 1.006 (ref 1.005–1.030)
pH: 8 (ref 5.0–8.0)

## 2016-12-27 LAB — CBC
HEMATOCRIT: 40.9 % (ref 35.0–47.0)
Hemoglobin: 13.8 g/dL (ref 12.0–16.0)
MCH: 30.1 pg (ref 26.0–34.0)
MCHC: 33.8 g/dL (ref 32.0–36.0)
MCV: 89 fL (ref 80.0–100.0)
Platelets: 290 10*3/uL (ref 150–440)
RBC: 4.6 MIL/uL (ref 3.80–5.20)
RDW: 13.5 % (ref 11.5–14.5)
WBC: 8.7 10*3/uL (ref 3.6–11.0)

## 2016-12-27 LAB — LIPASE, BLOOD: LIPASE: 27 U/L (ref 11–51)

## 2016-12-27 NOTE — ED Triage Notes (Signed)
Patient states that she had gallbladder surgery and hernia repair on Thursday. Patient states that today she developed a fever, increased pain and a rash across her abdomen. Patient states that her fever was 99.5 after taking percocet.

## 2016-12-27 NOTE — Anesthesia Postprocedure Evaluation (Signed)
Anesthesia Post Note  Patient: Tammy Johnson  Procedure(s) Performed: Procedure(s) (LRB): LAPAROSCOPIC CHOLECYSTECTOMY (N/A) HERNIA REPAIR UMBILICAL ADULT  Patient location during evaluation: PACU Anesthesia Type: General Level of consciousness: awake and alert and oriented Pain management: pain level controlled Vital Signs Assessment: post-procedure vital signs reviewed and stable Respiratory status: spontaneous breathing Cardiovascular status: blood pressure returned to baseline Anesthetic complications: no     Last Vitals:  Vitals:   12/25/16 1127 12/25/16 1200  BP: 114/62 114/60  Pulse: 72 72  Resp:    Temp:      Last Pain:  Vitals:   12/26/16 0840  TempSrc:   PainSc: 1                  Gennavieve Huq

## 2016-12-28 ENCOUNTER — Emergency Department
Admission: EM | Admit: 2016-12-28 | Discharge: 2016-12-28 | Disposition: A | Discharge status: Home / Self Care | Payer: No Typology Code available for payment source | Attending: Emergency Medicine | Admitting: Emergency Medicine

## 2016-12-28 ENCOUNTER — Encounter: Payer: Self-pay | Admitting: Radiology

## 2016-12-28 ENCOUNTER — Emergency Department: Payer: No Typology Code available for payment source

## 2016-12-28 DIAGNOSIS — R1084 Generalized abdominal pain: Secondary | ICD-10-CM

## 2016-12-28 LAB — LACTIC ACID, PLASMA: Lactic Acid, Venous: 0.7 mmol/L (ref 0.5–1.9)

## 2016-12-28 MED ORDER — FENTANYL CITRATE (PF) 100 MCG/2ML IJ SOLN
75.0000 ug | Freq: Once | INTRAMUSCULAR | Status: AC
  Administered 2016-12-28: 75 ug via INTRAVENOUS
  Filled 2016-12-28: qty 2

## 2016-12-28 MED ORDER — IOPAMIDOL (ISOVUE-300) INJECTION 61%
100.0000 mL | Freq: Once | INTRAVENOUS | Status: AC | PRN
  Administered 2016-12-28: 100 mL via INTRAVENOUS

## 2016-12-28 MED ORDER — SODIUM CHLORIDE 0.9 % IV BOLUS (SEPSIS)
1000.0000 mL | Freq: Once | INTRAVENOUS | Status: AC
  Administered 2016-12-28: 1000 mL via INTRAVENOUS

## 2016-12-28 NOTE — Discharge Instructions (Signed)
Fortunately today your blood work and her CT scan were reassuring. Please keep your appointment to see her surgeon Dr. Burt Knack this coming Monday for recheck. Return to the emergency department sooner for any new or worsening symptoms such as fevers, chills, worsening pain, if you cannot eat or drink, or for any other concerns whatsoever.  It was a pleasure to take care of you today, and thank you for coming to our emergency department.  If you have any questions or concerns before leaving please ask the nurse to grab me and I'm more than happy to go through your aftercare instructions again.  If you were prescribed any opioid pain medication today such as Norco, Vicodin, Percocet, morphine, hydrocodone, or oxycodone please make sure you do not drive when you are taking this medication as it can alter your ability to drive safely.  If you have any concerns once you are home that you are not improving or are in fact getting worse before you can make it to your follow-up appointment, please do not hesitate to call 911 and come back for further evaluation.  Darel Hong MD  Results for orders placed or performed during the hospital encounter of 12/28/16  Lipase, blood  Result Value Ref Range   Lipase 27 11 - 51 U/L  Comprehensive metabolic panel  Result Value Ref Range   Sodium 138 135 - 145 mmol/L   Potassium 3.9 3.5 - 5.1 mmol/L   Chloride 104 101 - 111 mmol/L   CO2 28 22 - 32 mmol/L   Glucose, Bld 95 65 - 99 mg/dL   BUN 10 6 - 20 mg/dL   Creatinine, Ser 0.84 0.44 - 1.00 mg/dL   Calcium 8.9 8.9 - 10.3 mg/dL   Total Protein 6.9 6.5 - 8.1 g/dL   Albumin 3.9 3.5 - 5.0 g/dL   AST 517 (H) 15 - 41 U/L   ALT 818 (H) 14 - 54 U/L   Alkaline Phosphatase 162 (H) 38 - 126 U/L   Total Bilirubin 0.6 0.3 - 1.2 mg/dL   GFR calc non Af Amer >60 >60 mL/min   GFR calc Af Amer >60 >60 mL/min   Anion gap 6 5 - 15  CBC  Result Value Ref Range   WBC 8.7 3.6 - 11.0 K/uL   RBC 4.60 3.80 - 5.20 MIL/uL   Hemoglobin 13.8 12.0 - 16.0 g/dL   HCT 40.9 35.0 - 47.0 %   MCV 89.0 80.0 - 100.0 fL   MCH 30.1 26.0 - 34.0 pg   MCHC 33.8 32.0 - 36.0 g/dL   RDW 13.5 11.5 - 14.5 %   Platelets 290 150 - 440 K/uL  Urinalysis, Complete w Microscopic  Result Value Ref Range   Color, Urine YELLOW (A) YELLOW   APPearance CLEAR (A) CLEAR   Specific Gravity, Urine 1.006 1.005 - 1.030   pH 8.0 5.0 - 8.0   Glucose, UA NEGATIVE NEGATIVE mg/dL   Hgb urine dipstick NEGATIVE NEGATIVE   Bilirubin Urine NEGATIVE NEGATIVE   Ketones, ur NEGATIVE NEGATIVE mg/dL   Protein, ur NEGATIVE NEGATIVE mg/dL   Nitrite NEGATIVE NEGATIVE   Leukocytes, UA NEGATIVE NEGATIVE   RBC / HPF 0-5 0 - 5 RBC/hpf   WBC, UA 0-5 0 - 5 WBC/hpf   Bacteria, UA MANY (A) NONE SEEN   Squamous Epithelial / LPF 0-5 (A) NONE SEEN  Lactic acid, plasma  Result Value Ref Range   Lactic Acid, Venous 0.7 0.5 - 1.9 mmol/L   Ct  Abdomen Pelvis W Contrast  Result Date: 12/28/2016 CLINICAL DATA:  Fever, redness, swelling and pain and incision, status post cholecystectomy and hernia repair 2 days ago. History of RIGHT breast cancer, nephrolithiasis. EXAM: CT ABDOMEN AND PELVIS WITH CONTRAST TECHNIQUE: Multidetector CT imaging of the abdomen and pelvis was performed using the standard protocol following bolus administration of intravenous contrast. CONTRAST:  175mL ISOVUE-300 IOPAMIDOL (ISOVUE-300) INJECTION 61% COMPARISON:  CT abdomen and pelvis June 24, 2015 FINDINGS: LOWER CHEST: Dependent atelectasis. Heart size is normal. No pericardial effusion. HEPATOBILIARY: Status post cholecystectomy with surgical clips in the gallbladder fossa and slight fat stranding, no focal fluid collection. No choledocholithiasis. Similar trace periportal edema, borderline hepatomegaly. Patent portal vein. PANCREAS: Normal. SPLEEN: Normal. ADRENALS/URINARY TRACT: Kidneys are orthotopic, demonstrating symmetric enhancement. No nephrolithiasis, hydronephrosis or solid renal masses.  The unopacified ureters are normal in course and caliber. Delayed imaging through the kidneys demonstrates symmetric prompt contrast excretion within the proximal urinary collecting system. Urinary bladder is partially distended and unremarkable. Normal adrenal glands. STOMACH/BOWEL: The stomach, small and large bowel are normal in course and caliber without inflammatory changes, sensitivity decreased without oral contrast. Mild amount of retained large bowel stool. Normal appendix. VASCULAR/LYMPHATIC: Aortoiliac vessels are normal in course and caliber. No lymphadenopathy by CT size criteria. REPRODUCTIVE: Normal. OTHER: Small amount of free fluid in the pelvis is likely physiologic. MUSCULOSKELETAL: Nonacute. Interval umbilical hernia repair with fat stranding and minimal gas, no focal fluid collection. Faint anterior abdominal wall subcutaneous fat stranding and gas consistent with laparoscopic port sites. IMPRESSION: Status post recent cholecystectomy and umbilical hernia repair with expected postoperative change. Borderline in hepatomegaly with similar trace periportal edema. No acute intra-abdominal or pelvic process. Electronically Signed   By: Elon Alas M.D.   On: 12/28/2016 02:47

## 2016-12-28 NOTE — ED Provider Notes (Signed)
[facility] Emergency Department Provider Note  ____________________________________________   First MD Initiated Contact with Patient 12/28/16 0037     (approximate)  I have reviewed the triage vital signs and the nursing notes.   HISTORY  Chief Complaint Rash; Abdominal Pain; and Fever    HPI Tammy Johnson is a 36 y.o. female who self presents to the emergency department with abdominal pain and fever. 3 days ago she had a laparoscopic cholecystectomy by Dr. Burt Knack at our hospital. She is discharged home and initially did well postoperatively but starting about 24 hours ago she has noted increasing amounts of abdominal pain. The pain is diffuse cramping and uncomfortable. Nothing particular seems to make it better or worse. She took Percocet and shortly thereafter noticed that her temperature at home was 99.5 which concerned her and she had artery had acetaminophen. She's also noted scant drainage from her incisional wounds. As well as a erythematous itching rash across her abdomen. She's had no chest pain or shortness of breath.   Past Medical History:  Diagnosis Date  . Abdominal pain 06/27/2015  . Asthma    AS A CHILD  . Calculus of kidney 10/18/2013  . Cancer [facility]) 2005 and 2008   breast right side   . 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 Intractable migraine without aura 01/11/2014  . Malignant neoplasm of breast ([place]) 10/18/2013  . Migraine   . Migraines 11/13/2013  . TB lung, latent 2014   + TB Skin test; Prophalactic Rofampin    Patient Active Problem List   Diagnosis Date Noted  . Calculus of gallbladder with chronic cholecystitis without obstruction   . Umbilical hernia without obstruction and without gangrene   . Abdominal pain 06/27/2015  . Intractable migraine without aura 01/11/2014  . Migraines 11/13/2013  . Malignant neoplasm of breast ([place]) 10/18/2013    . Calculus of kidney 10/18/2013  . 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 BREAST SURGERY Right 2005   Lumpectomy  . CHOLECYSTECTOMY N/A 12/25/2016   Procedure: LAPAROSCOPIC CHOLECYSTECTOMY;  Surgeon: Florene Glen, MD;  Location: [facility] ORS;  Service: General;  Laterality: N/A;  . LAPAROSCOPIC ENDOMETRIOSIS FULGURATION  1999  . RT BREAST MASS    . UMBILICAL HERNIA REPAIR  12/25/2016   Procedure: HERNIA REPAIR UMBILICAL ADULT;  Surgeon: Florene Glen, MD;  Location: [facility] ORS;  Service: General;;    Prior to Admission medications   Medication Sig Start Date End Date Taking? Authorizing Provider  aspirin-acetaminophen-caffeine (EXCEDRIN EXTRA STRENGTH) [phone] MG tablet Take 1 tablet by mouth every 6 (six) hours as needed for headache.    [provider]  cetirizine (ZYRTEC) 10 MG tablet Take 10 mg by mouth daily as needed for allergies.    [provider]  EPINEPHrine 0.3 mg/0.3 mL IJ SOAJ injection Inject 0.3 mLs (0.3 mg total) into the muscle once. 04/10/15   [name], MD  ondansetron (ZOFRAN) 4 MG tablet Take 1 tablet (4 mg total) by mouth every 6 (six) hours as needed for nausea. 6/71/24   Delora Fuel, MD  oxyCODONE-acetaminophen (PERCOCET) 5-325 MG tablet Take 1-2 tablets by mouth every 4 (four) hours as needed for moderate pain. 12/25/16   Florene Glen, MD  potassium chloride (KLOR-CON) 20 MEQ packet Take 20 mEq by mouth  2 (two) times daily. 12/22/16 12/27/16  Florene Glen, MD    Allergies Bee venom; Penicillins; Dapsone; Clindamycin/lincomycin; and Latex  Family History  Problem Relation Age of Onset  . Cervical cancer Maternal Grandmother   . Heart disease Maternal Grandmother   . Cancer - Colon Maternal Grandmother   . Cancer Mother        breast   . Depression Brother   . Bipolar disorder Brother   .  Schizophrenia Brother     Social History Social History  Substance Use Topics  . Smoking status: Former Smoker    Quit date: 03/18/2014  . Smokeless tobacco: Never Used  . Alcohol use Yes     Comment: Occasional    Review of Systems Constitutional: Positive fever Eyes: No visual changes. ENT: No sore throat. Cardiovascular: Denies chest pain. Respiratory: Denies shortness of breath. Gastrointestinal: Positive abdominal pain.  Positive nausea, no vomiting.  No diarrhea.  No constipation. Genitourinary: Negative for dysuria. Musculoskeletal: Negative for back pain. Skin: Positive for rash. Neurological: Negative for headaches, focal weakness or numbness.   ____________________________________________   PHYSICAL EXAM:  VITAL SIGNS: ED Triage Vitals  Enc Vitals Group     BP 12/27/16 2302 114/68     Pulse Rate 12/27/16 2302 78     Resp 12/27/16 2302 18     Temp 12/27/16 2302 98.5 F (36.9 C)     Temp Source 12/27/16 2302 Oral     SpO2 12/27/16 2302 99 %     Weight 12/27/16 2302 150 lb (68 kg)     Height 12/27/16 2302 5\' 6"  (1.676 m)     Head Circumference --      Peak Flow --      Pain Score 12/27/16 2301 7     Pain Loc --      Pain Edu? --      Excl. in [place]? --     Constitutional: Alert and oriented 4 very well-appearing nontoxic no diaphoresis speaks in full clear sentences Eyes: PERRL EOMI. Head: Atraumatic. Nose: No congestion/rhinnorhea. Mouth/Throat: No trismus Neck: No stridor.   Cardiovascular: Normal rate, regular rhythm. Grossly normal heart sounds.  Good peripheral circulation. Respiratory: Normal respiratory effort.  No retractions. Lungs CTAB and moving good air Gastrointestinal: Soft abdomen mild diffuse tenderness without focality no rebound no guarding surgical wounds appear healthy with serous discharge no purulence Musculoskeletal: No lower extremity edema   Neurologic:  Normal speech and language. No gross focal neurologic deficits are  appreciated. Skin:  Faint nonpalpable erythematous rash across her diffuse abdomen with excoriations Psychiatric: Mood and affect are normal. Speech and behavior are normal.    ____________________________________________   DIFFERENTIAL includes but not limited to  Intra-abdominal abscess, cellulitis, pulmonary embolism, cellulitis, allergic reaction ____________________________________________   LABS (all labs ordered are listed, but only abnormal results are displayed)  Labs Reviewed  COMPREHENSIVE METABOLIC PANEL - Abnormal; Notable for the following:       Result Value   AST 517 (*)    ALT 818 (*)    Alkaline Phosphatase 162 (*)    All other components within normal limits  URINALYSIS, COMPLETE (UACMP) WITH MICROSCOPIC - Abnormal; Notable for the following:    Color, Urine YELLOW (*)    APPearance CLEAR (*)    Bacteria, UA MANY (*)    Squamous Epithelial / LPF 0-5 (*)    All other components within normal limits  LIPASE, BLOOD  CBC  LACTIC ACID, PLASMA  LACTIC ACID, PLASMA  normal lactate and white count are reassuring Elevated LFTs likely represent postsurgical changes  EKG   ____________________________________________  RADIOLOGY  CT scan of the abdomen and pelvis reviewed by me shows normal postsurgical changes and no evidence of infection ____________________________________________   PROCEDURES  Procedure(s) performed: no  Procedures  Critical Care performed: no  Observation: no ____________________________________________   INITIAL IMPRESSION / ASSESSMENT AND PLAN / ED COURSE  Pertinent labs & imaging results that were available during my care of the patient were reviewed by me and considered in my medical decision making (see chart for details).  The patient arrives with increasing abdominal pain and subjective fever shortly after having a laparoscopic cholecystectomy. We'll she is well-appearing with a relatively benign abdominal exam now,  I'm concerned about a postoperative infection. I will get a CT scan as well as adding on a lactate and LFTs.  Fortunately the patient's CT scan is reassuring with no intra-abdominal abscess and no signs of infection. She R he has follow-up with Dr. Burt Knack scheduled this coming Monday. She will be discharged home in improved condition.      ____________________________________________   FINAL CLINICAL IMPRESSION(S) / ED DIAGNOSES  Final diagnoses:  Generalized abdominal pain      NEW MEDICATIONS STARTED DURING THIS VISIT:  New Prescriptions   No medications on file     Note:  This document was prepared using Dragon voice recognition software and may include unintentional dictation errors.     Darel Hong, MD 12/28/16 [phone]

## 2016-12-28 NOTE — ED Notes (Signed)
Patient to CT.

## 2016-12-28 NOTE — ED Notes (Signed)
Reviewed d/c instructions, follow-up care with patient. Pt verbalized understanding.  

## 2016-12-28 NOTE — ED Notes (Signed)
CT called

## 2016-12-29 ENCOUNTER — Other Ambulatory Visit: Payer: Self-pay

## 2017-01-02 ENCOUNTER — Encounter: Payer: Self-pay | Admitting: Surgery

## 2017-01-02 ENCOUNTER — Ambulatory Visit (INDEPENDENT_AMBULATORY_CARE_PROVIDER_SITE_OTHER): Payer: No Typology Code available for payment source | Admitting: Surgery

## 2017-01-02 VITALS — BP 109/73 | HR 100 | Temp 98.3°F | Ht 66.0 in | Wt 146.6 lb

## 2017-01-02 DIAGNOSIS — K8020 Calculus of gallbladder without cholecystitis without obstruction: Secondary | ICD-10-CM

## 2017-01-02 NOTE — Patient Instructions (Signed)
Please call our office if you have any questions or concerns.  

## 2017-01-02 NOTE — Progress Notes (Signed)
Outpatient postop visit  01/02/2017  Tammy Johnson is an 36 y.o. female.    Procedure: Laparoscopic cholecystectomy and primary repair of umbilical hernia  CC: Abdominal pain and rash  HPI: Patient had called me last week concerned about a rash on her abdomen and I had recommended cortisone cream. That rash was described to me as being xiphoid to pubis and side to side suggesting a reaction to the Hibiclens prep that had been used.  She was then seen in the emergency room by the ER physician where a CT scan was negative for abdominal pain and she was sent home. Her labs of been reviewed and were completely normal.  Patient states today that she was not really having much abdominal pain at the time of her ER visit but was forced to go to the ER by her family was concerned about her temp of 99.5. In the ER she was worked up thoroughly and found to have no significant problems. Today she has no significant problems she is happy that her diarrhea that she's had for a year has stopped and that she feels better and can eat whatever she wants now  Medications reviewed.    Physical Exam:  LMP 12/16/2016 (Exact Date)     PE: Mild resolving rash on the entire abdomen with minimal ecchymosis no erythema around the wounds the Steri-Strips are still in place with no drainage and no tenderness nontender calves no icterus no jaundice    Assessment/Plan:  Patient doing very well at this point continuing her cortisone cream for her rash which is almost resolved. She can follow up on an as-needed basis I reminded her of no heavy lifting for another 2 weeks due to her hernia repair.  Florene Glen, MD, FACS

## 2019-07-13 ENCOUNTER — Other Ambulatory Visit: Payer: Self-pay

## 2019-07-13 ENCOUNTER — Emergency Department (HOSPITAL_COMMUNITY)
Admission: EM | Admit: 2019-07-13 | Discharge: 2019-07-13 | Disposition: A | Discharge status: Home / Self Care | Payer: No Typology Code available for payment source | Attending: Emergency Medicine | Admitting: Emergency Medicine

## 2019-07-13 ENCOUNTER — Encounter (HOSPITAL_COMMUNITY): Payer: Self-pay | Admitting: Emergency Medicine

## 2019-07-13 ENCOUNTER — Emergency Department (HOSPITAL_COMMUNITY): Payer: No Typology Code available for payment source

## 2019-07-13 DIAGNOSIS — S99921A Unspecified injury of right foot, initial encounter: Secondary | ICD-10-CM | POA: Diagnosis present

## 2019-07-13 DIAGNOSIS — Y999 Unspecified external cause status: Secondary | ICD-10-CM | POA: Diagnosis not present

## 2019-07-13 DIAGNOSIS — Y92009 Unspecified place in unspecified non-institutional (private) residence as the place of occurrence of the external cause: Secondary | ICD-10-CM | POA: Insufficient documentation

## 2019-07-13 DIAGNOSIS — S92214A Nondisplaced fracture of cuboid bone of right foot, initial encounter for closed fracture: Secondary | ICD-10-CM | POA: Diagnosis not present

## 2019-07-13 DIAGNOSIS — Z87891 Personal history of nicotine dependence: Secondary | ICD-10-CM | POA: Insufficient documentation

## 2019-07-13 DIAGNOSIS — Z91040 Latex allergy status: Secondary | ICD-10-CM | POA: Diagnosis not present

## 2019-07-13 DIAGNOSIS — W51XXXA Accidental striking against or bumped into by another person, initial encounter: Secondary | ICD-10-CM | POA: Insufficient documentation

## 2019-07-13 DIAGNOSIS — Z853 Personal history of malignant neoplasm of breast: Secondary | ICD-10-CM | POA: Insufficient documentation

## 2019-07-13 DIAGNOSIS — Y9383 Activity, rough housing and horseplay: Secondary | ICD-10-CM | POA: Insufficient documentation

## 2019-07-13 DIAGNOSIS — J45909 Unspecified asthma, uncomplicated: Secondary | ICD-10-CM | POA: Diagnosis not present

## 2019-07-13 MED ORDER — HYDROCODONE-ACETAMINOPHEN 5-325 MG PO TABS: 2.0000 | ORAL_TABLET | ORAL | 0 refills | Status: DC | PRN

## 2019-07-13 MED ORDER — HYDROCODONE-ACETAMINOPHEN 5-325 MG PO TABS: 1.0000 | ORAL_TABLET | Freq: Four times a day (QID) | ORAL | 0 refills | Status: DC | PRN

## 2019-07-13 NOTE — ED Provider Notes (Signed)
[facility] EMERGENCY DEPARTMENT Provider Note   CSN: [id number] Arrival date & time: 07/13/19  1118     History Chief Complaint  Patient presents with  . Foot Injury    Tammy Johnson is a 39 y.o. female, who presents emergency department chief complaint of right foot injury.  Patient states that she was "horsing around" with her 20 year old daughter last night playing basketball in the house.  She states that the both fell down and her daughter landed on her right foot.  She is unsure how she injured the foot otherwise.  She had immediate pain, swelling and bruising of foot.  She applied ice and elevated the foot.  She states that her toes are numb but she has "terrible pain" in her foot.  She has been unable to bear weight on the foot since that time.  She denies any previous injuries to the foot or ankle.  HPI     Past Medical History:  Diagnosis Date  . Abdominal pain 06/27/2015  . Asthma    AS A CHILD  . Calculus of kidney 10/18/2013  . Cancer [facility]) 2005 and 2008   breast right side   . 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 Intractable migraine without aura 01/11/2014  . Malignant neoplasm of breast ([place]) 10/18/2013  . Migraine   . Migraines 11/13/2013  . TB lung, latent 2014   + TB Skin test; Prophalactic Rofampin    Patient Active Problem List   Diagnosis Date Noted  . Calculus of gallbladder with chronic cholecystitis without obstruction   . Umbilical hernia without obstruction and without gangrene   . Abdominal pain 06/27/2015  . Intractable migraine without aura 01/11/2014  . Migraines 11/13/2013  . Malignant neoplasm of breast ([place]) 10/18/2013  . Calculus of kidney 10/18/2013  . 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 BREAST SURGERY Right 2005   Lumpectomy    . CHOLECYSTECTOMY N/A 12/25/2016   Procedure: LAPAROSCOPIC CHOLECYSTECTOMY;  Surgeon: Florene Glen, MD;  Location: [facility] ORS;  Service: General;  Laterality: N/A;  . LAPAROSCOPIC ENDOMETRIOSIS FULGURATION  1999  . RT BREAST MASS    . UMBILICAL HERNIA REPAIR  12/25/2016   Procedure: HERNIA REPAIR UMBILICAL ADULT;  Surgeon: Florene Glen, MD;  Location: [facility] ORS;  Service: General;;     OB History    Gravida  2   Para  2   Term  2   Preterm      AB      Living  2     SAB      TAB      Ectopic      Multiple      Live Births              Family History  Problem Relation Age of Onset  . Cervical cancer Maternal Grandmother   . Heart disease Maternal Grandmother   . Cancer - Colon Maternal Grandmother   . Cancer Mother        breast   . Depression Brother   . Bipolar disorder Brother   . Schizophrenia Brother     Social History   Tobacco Use  . Smoking status: Former Smoker    Quit date: 03/18/2014    Years since quitting: 5.3  . Smokeless tobacco: Never  Used  Substance Use Topics  . Alcohol use: Yes    Comment: Occasional  . Drug use: No    Home Medications Prior to Admission medications   Medication Sig Start Date End Date Taking? Authorizing Provider  aspirin-acetaminophen-caffeine (EXCEDRIN EXTRA STRENGTH) [phone] MG tablet Take 1 tablet by mouth every 6 (six) hours as needed for headache.    [provider]  cetirizine (ZYRTEC) 10 MG tablet Take 10 mg by mouth daily as needed for allergies.    [provider]  EPINEPHrine 0.3 mg/0.3 mL IJ SOAJ injection Inject 0.3 mLs (0.3 mg total) into the muscle once. 04/10/15   [name], MD  ondansetron (ZOFRAN) 4 MG tablet Take 1 tablet (4 mg total) by mouth every 6 (six) hours as needed for nausea. [id number]   Delora Fuel, MD  oxyCODONE-acetaminophen (PERCOCET) 5-325 MG tablet Take 1-2 tablets by mouth every 4 (four) hours as needed for moderate pain. 12/25/16   Florene Glen, MD  potassium chloride (KLOR-CON) 20 MEQ packet Take 20 mEq by mouth 2 (two) times daily. 12/22/16 12/27/16  Florene Glen, MD    Allergies    Bee venom, Hibiclens [chlorhexidine gluconate], Penicillins, Dapsone, Clindamycin/lincomycin, and Latex  Review of Systems   Review of Systems Ten systems reviewed and are negative for acute change, except as noted in the HPI.   Physical Exam Updated Vital Signs BP (!) 103/54 (BP Location: Right Arm)   Pulse 81   Temp 97.7 F (36.5 C) (Oral)   Resp 14   Ht 5\' 6"  (1.676 m)   Wt 78.9 kg   LMP 07/13/2019   SpO2 99%   BMI 28.08 kg/m   Physical Exam Vitals and nursing note reviewed.  Constitutional:      General: She is not in acute distress.    Appearance: She is well-developed. She is not diaphoretic.  HENT:     Head: Normocephalic and atraumatic.  Eyes:     General: No scleral icterus.    Conjunctiva/sclera: Conjunctivae normal.  Cardiovascular:     Rate and Rhythm: Normal rate and regular rhythm.     Heart sounds: Normal heart sounds. No murmur. No friction rub. No gallop.   Pulmonary:     Effort: Pulmonary effort is normal. No respiratory distress.     Breath sounds: Normal breath sounds.  Abdominal:     General: Bowel sounds are normal. There is no distension.     Palpations: Abdomen is soft. There is no mass.     Tenderness: There is no abdominal tenderness. There is no guarding.  Musculoskeletal:     Cervical back: Normal range of motion.     Right foot: Decreased range of motion. Normal capillary refill. Normal pulse.     Left foot: Normal.     Comments: There is swelling to the right foot with swelling over the mid and lateral aspect of the foot along with ecchymosis. The patient is numb to light touch throughout the entire foot.  She does not feel deep touch and painful stimulus.  She is unable to wiggle her toes.  She is able to move the ankle.   Skin:    General: Skin is warm and dry.  Neurological:     Mental  Status: She is alert and oriented to person, place, and time.  Psychiatric:        Behavior: Behavior normal.     ED Results / Procedures / Treatments   Labs (all labs ordered are  listed, but only abnormal results are displayed) Labs Reviewed - No data to display  EKG None  Radiology DG Foot Complete Right  Result Date: 07/13/2019 CLINICAL DATA:  Right foot pain with bruising and swelling after an injury last night. EXAM: RIGHT FOOT COMPLETE - 3+ VIEW COMPARISON:  None. FINDINGS: There is no evidence of fracture or dislocation. There is no evidence of arthropathy or other focal bone abnormality. Soft tissues are unremarkable. IMPRESSION: Negative. Electronically Signed   By: Logan Bores M.D.   On: 07/13/2019 11:52    Procedures Procedures (including critical care time)  Medications Ordered in ED Medications - No data to display  ED Course  I have reviewed the triage vital signs and the nursing notes.  Pertinent labs & imaging results that were available during my care of the patient were reviewed by me and considered in my medical decision making (see chart for details).  Clinical Course as of Jul 12 1745  Wed Jul 13, 2019  1548 I spoke with Dr. Aline Brochure about the CT findings on the Foot. He recommends Cam walker, WBAT. Patient has crutches at home   [AH]    Clinical Course User Index [AH] Margarita Mail, PA-C   MDM Rules/Calculators/A&P                      Patient presents to the ED with cc of foot pain after injury. I reviewed the patient's plain film which shows no abnormality on my interpretation. CT scan shows a CUbod fracture with extension into the articulating surface with the metatarsals. Case discussed with Dr. Aline Brochure. Will give Cam walker, WBAT with crutches. Norco at [employer] after PDMP review. Patient will be discharged to follow closely with DR. Aline Brochure. Final Clinical Impression(s) / ED Diagnoses Final diagnoses:  Closed nondisplaced fracture of cuboid of  right foot, initial encounter    Rx / DC Orders ED Discharge Orders    None       Margarita Mail, PA-C 07/13/19 1752    Merryl Hacker, MD 07/18/19 [phone]

## 2019-07-13 NOTE — Discharge Instructions (Signed)
You may bear weight as tolerated in your cam walker boot.  Please use your crutches to help relieve some of the pressure if it is hurting.  I have ordered a short course of narcotic pain medications.  This may cause constipation.  Do not make important life decisions, submerge in water, drive or combine with alcohol while taking these.  Return for the reasons listed below. Get help right away if: You have severe pain. Your toes turn pale and cold or blue. You lose feeling in your toes. You have redness, warmth, and tenderness in the back of your leg. You have chest pain or difficulty breathing.

## 2019-07-13 NOTE — ED Triage Notes (Signed)
Pt states that she was horseplaying yesterday and hurt her right foot.

## 2019-07-18 ENCOUNTER — Other Ambulatory Visit: Payer: Self-pay

## 2019-07-18 ENCOUNTER — Ambulatory Visit: Payer: No Typology Code available for payment source | Admitting: Orthopedic Surgery

## 2019-07-18 ENCOUNTER — Encounter: Payer: Self-pay | Admitting: Orthopedic Surgery

## 2019-07-18 VITALS — BP 119/74 | HR 98 | Ht 66.0 in | Wt 174.0 lb

## 2019-07-18 DIAGNOSIS — S92214A Nondisplaced fracture of cuboid bone of right foot, initial encounter for closed fracture: Secondary | ICD-10-CM | POA: Diagnosis not present

## 2019-07-18 MED ORDER — HYDROCODONE-ACETAMINOPHEN 5-325 MG PO TABS: 1.0000 | ORAL_TABLET | Freq: Four times a day (QID) | ORAL | 0 refills | Status: DC | PRN

## 2019-07-18 MED ORDER — IBUPROFEN 800 MG PO TABS: 800.0000 mg | ORAL_TABLET | Freq: Three times a day (TID) | ORAL | 1 refills | Status: DC | PRN

## 2019-07-18 NOTE — Progress Notes (Signed)
Chief Complaint  Patient presents with  . 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  The patient eventually developed severe pain and went to the emergency room on July 6  She was in severe pain she had bruising and ecchymosis on the dorsal lateral portion of the foot she was complaining of numbness and tingling of the foot and she could not weight-bear.  X-rays were obtained they were normal but fortunately a CT scan was obtained and it showed a cuboid fracture which was nondisplaced.  The patient was placed in a weightbearing as tolerated boot although she has not been able to weight-bear and her pain has only slightly improved on ibuprofen and intermittent hydrocodone  The patient and I are both very concerned as her history physical exam findings and imaging studies do not quite match up.  The patient says she is usually very tough has good pain tolerance and is not sure why with such a low energy trauma to the foot that she is having so much discomfort    Review of Systems  Skin: Negative for itching and rash.  Neurological: Positive for tingling and sensory change. Negative for weakness.   Past Medical History:  Diagnosis Date  . Abdominal pain 06/27/2015  . Asthma    AS A CHILD  . Calculus of kidney 10/18/2013  . Cancer [facility]) 2005 and 2008   breast right side   . 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 Intractable migraine without aura 01/11/2014  . Malignant neoplasm of breast ([place]) 10/18/2013  . Migraine   . Migraines 11/13/2013  . TB lung, latent 2014   + TB Skin test; Prophalactic Rofampin    BP 119/74   Pulse 98   Ht 5\' 6"  (1.676 m)   Wt 174 lb (78.9 kg)   LMP 07/13/2019   BMI 28.08 kg/m   Physical Exam Vitals and  nursing note reviewed.  Constitutional:      Appearance: Normal appearance.  Musculoskeletal:     Right foot: Decreased range of motion. Normal capillary refill. Swelling, tenderness and bony tenderness present. No deformity, bunion, Charcot foot, foot drop, prominent metatarsal heads, laceration or crepitus. Normal pulse.       Legs:  Neurological:     Mental Status: She is alert and oriented to person, place, and time.  Psychiatric:        Mood and Affect: Mood normal.    Independent interpretation of imaging studies x-ray AP lateral and oblique right foot no fracture dislocation osteopenia or osteoporosis is seen  Independent interpretation of CT scan nondisplaced fracture intra-articular aspect calcaneocuboid joint  Review of CT scan report Soft tissue swelling laterally in the forefoot without focal hematoma.   IMPRESSION: 1. Acute nondisplaced fracture of the cuboid with distal extension into the articulations with the 4th and 5th metatarsal bases. No proximal extension into the calcaneocuboid articulation. 2. No other acute osseous findings.     Electronically Signed   By: Richardean Sale M.D.   On: 07/13/2019 15:03     Encounter Diagnosis  Name Primary?  . Closed nondisplaced fracture of cuboid of right foot, initial encounter Yes    Acute complicated injury (dorsal lateral cutaneous nerve damage)  The patient's symptoms do  not match up with her imaging studies and her history of injury does not seem to match the injury that we see.  Perhaps this is a ligamentous injury.  I have informed her of this.  She will be nonweightbearing.  I have encouraged her to take her medication more frequently and to get out of the bed every day and do normal activities such as bathing walking to the living room but elevate the foot when she is not up on it.  She will need to be out of work if they cannot find a sitdown job  She will need a stress weightbearing foot x-ray to test  for ligament injury of the tarsometatarsal joint  Meds ordered this encounter  Medications  . ibuprofen (ADVIL) 800 MG tablet    Sig: Take 1 tablet (800 mg total) by mouth every 8 (eight) hours as needed.    Dispense:  90 tablet    Refill:  1  . HYDROcodone-acetaminophen (NORCO/VICODIN) 5-325 MG tablet    Sig: Take 1 tablet by mouth every 6 (six) hours as needed for moderate pain.    Dispense:  30 tablet    Refill:  0    Summation acute complicated injury, prescription drug management, independent interpretation of 2 images and review of outside notes and review of report

## 2019-07-18 NOTE — Patient Instructions (Addendum)
No weight on the right foot   Work note ok to sit but if not no work 4 weeks   Pain meds: ibuprofen and hydrocodone  Expect swelling for 6 months

## 2019-07-19 ENCOUNTER — Telehealth: Payer: Self-pay | Admitting: Orthopedic Surgery

## 2019-07-19 NOTE — Telephone Encounter (Signed)
I called patient upon receipt of forms received by fax from [employer], [place]'s absence management insurer. Patient had been made aware of forms process through [employer] at time of visit yesterday, 07/18/19. States she was advised that a determination will be made once forms are received, as said was told she does not yet qualify for [employer].  States she will also be filing short-term disability forms, and is aware that this second set of forms would also require a fee and authorization forms per [employer] process. States she understands, however, due to out of work situation, is concerned about the form fees. Aware that the follow up forms will be completed without charge. Is there any option, at least for one of the fees?

## 2019-07-20 NOTE — Telephone Encounter (Signed)
I called back to patient; notified. Patient greatly appreciates it.

## 2019-07-20 NOTE — Telephone Encounter (Signed)
Ok by me to waive fee.  If we need to pull from [employer] and do the form in house we can.

## 2019-08-08 ENCOUNTER — Ambulatory Visit (INDEPENDENT_AMBULATORY_CARE_PROVIDER_SITE_OTHER): Payer: No Typology Code available for payment source | Admitting: Orthopedic Surgery

## 2019-08-08 ENCOUNTER — Ambulatory Visit: Payer: No Typology Code available for payment source

## 2019-08-08 ENCOUNTER — Other Ambulatory Visit: Payer: Self-pay

## 2019-08-08 ENCOUNTER — Encounter: Payer: Self-pay | Admitting: Orthopedic Surgery

## 2019-08-08 VITALS — BP 109/72 | HR 85 | Temp 98.1°F | Ht 66.0 in | Wt 174.0 lb

## 2019-08-08 DIAGNOSIS — S92214A Nondisplaced fracture of cuboid bone of right foot, initial encounter for closed fracture: Secondary | ICD-10-CM | POA: Diagnosis not present

## 2019-08-08 DIAGNOSIS — G5731 Lesion of lateral popliteal nerve, right lower limb: Secondary | ICD-10-CM

## 2019-08-08 NOTE — Progress Notes (Signed)
Chief Complaint  Patient presents with  . Follow-up    Recheck on right foot, DOI 07-13-19.    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  Her CAT scan was required to show the cuboid fracture however she had excessive pain numbness and tingling on the dorsum of the foot  Initial impression was acute nondisplaced fracture cuboid with extension into the articulation of the fourth and fifth metatarsal bases with no other osseous findings including a normal Lisfranc joint  She was treated with long cam walker ibuprofen and hydrocodone  She comes in today complaining of  Weightbearing x-rays show no evidence of Lisfranc injury  Clinical exam still has superficial peroneal nerve numbness on the dorsum of the foot and the toes she has some pain with weightbearing today on the medial lateral side of the foot but most of the pain is at the fracture site  Recommend start weightbearing as tolerated  Out of work 3 weeks  Follow-up 3 weeks repeat x-ray  Continue ibuprofen  Encounter Diagnoses  Name Primary?  . Closed nondisplaced fracture of cuboid of right foot, initial encounter 07/13/2019 Yes  . Neuropathy of right superficial peroneal nerve    Designated acute complicated injury by the nerve involvement  In office x-ray  Minimal risk

## 2019-08-08 NOTE — Patient Instructions (Addendum)
Weight bearing as tolerated WITH CAM WALKER AND CRUTCHES  New work note OOW 3 WEEKS   CONTINUE IBUPROFEN

## 2019-08-29 ENCOUNTER — Ambulatory Visit: Payer: No Typology Code available for payment source

## 2019-08-29 ENCOUNTER — Ambulatory Visit (INDEPENDENT_AMBULATORY_CARE_PROVIDER_SITE_OTHER): Payer: No Typology Code available for payment source | Admitting: Orthopedic Surgery

## 2019-08-29 ENCOUNTER — Other Ambulatory Visit: Payer: Self-pay

## 2019-08-29 VITALS — BP 121/80 | HR 80 | Temp 99.0°F | Ht 66.0 in | Wt 174.0 lb

## 2019-08-29 DIAGNOSIS — S92214D Nondisplaced fracture of cuboid bone of right foot, subsequent encounter for fracture with routine healing: Secondary | ICD-10-CM

## 2019-08-29 NOTE — Progress Notes (Signed)
Chief Complaint  Patient presents with  . Follow-up    Recheck on right foot fracture, DOI 07-13-19.   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  Her CAT scan was required to show the cuboid fracture however she had excessive pain numbness and tingling on the dorsum of the foot  racture care follow-up status post right foot injury)  Seems to be improving significantly.  This is day 73 I think x-ray looks good has very minimal tenderness at the TMT joint over the fourth and fifth proximal metatarsals  Her sensation has returned to near normal  So were going to do is get her some orthotics to support the foot she'll walk around in them at home and then if she is comfortable she will message me through my chart and I'll send her a return to work note if not we will plan I follow-up visit after that  Encounter Diagnosis  Name Primary?  . Closed nondisplaced fracture of cuboid of right foot with routine healing, subsequent encounter Yes

## 2019-08-29 NOTE — Patient Instructions (Signed)
Will communicate with my chart

## 2019-08-30 ENCOUNTER — Encounter: Payer: Self-pay | Admitting: Orthopedic Surgery

## 2020-09-20 ENCOUNTER — Other Ambulatory Visit (HOSPITAL_COMMUNITY): Payer: Self-pay | Admitting: Student

## 2020-09-20 DIAGNOSIS — Z1231 Encounter for screening mammogram for malignant neoplasm of breast: Secondary | ICD-10-CM

## 2020-10-01 ENCOUNTER — Ambulatory Visit (HOSPITAL_COMMUNITY)
Admission: RE | Admit: 2020-10-01 | Discharge: 2020-10-01 | Disposition: A | Discharge status: Home / Self Care | Payer: BC Managed Care – PPO | Source: Ambulatory Visit | Attending: Student | Admitting: Student

## 2020-10-01 ENCOUNTER — Other Ambulatory Visit: Payer: Self-pay

## 2020-10-01 ENCOUNTER — Encounter (HOSPITAL_COMMUNITY): Payer: Self-pay

## 2020-10-01 DIAGNOSIS — Z1231 Encounter for screening mammogram for malignant neoplasm of breast: Secondary | ICD-10-CM | POA: Insufficient documentation

## 2020-10-03 ENCOUNTER — Other Ambulatory Visit (HOSPITAL_COMMUNITY): Payer: Self-pay | Admitting: Student

## 2020-10-08 ENCOUNTER — Other Ambulatory Visit (HOSPITAL_COMMUNITY): Payer: Self-pay | Admitting: Student

## 2020-10-08 DIAGNOSIS — R928 Other abnormal and inconclusive findings on diagnostic imaging of breast: Secondary | ICD-10-CM

## 2020-10-11 ENCOUNTER — Ambulatory Visit (HOSPITAL_COMMUNITY)
Admission: RE | Admit: 2020-10-11 | Discharge: 2020-10-11 | Disposition: A | Discharge status: Home / Self Care | Payer: BC Managed Care – PPO | Source: Ambulatory Visit | Attending: Student | Admitting: Student

## 2020-10-11 ENCOUNTER — Other Ambulatory Visit: Payer: Self-pay

## 2020-10-11 DIAGNOSIS — R928 Other abnormal and inconclusive findings on diagnostic imaging of breast: Secondary | ICD-10-CM | POA: Insufficient documentation

## 2021-12-30 ENCOUNTER — Other Ambulatory Visit: Payer: Self-pay

## 2021-12-30 MED ORDER — MONTELUKAST SODIUM 10 MG PO TABS
10.0000 mg | ORAL_TABLET | Freq: Every day | ORAL | 1 refills | Status: DC
  Filled 2021-12-30: qty 90, 90d supply, fill #0

## 2021-12-30 MED ORDER — SUMATRIPTAN SUCCINATE 100 MG PO TABS
ORAL_TABLET | ORAL | 1 refills | Status: AC
  Filled 2021-12-30: qty 9, 30d supply, fill #0

## 2022-01-23 ENCOUNTER — Encounter: Payer: Self-pay | Admitting: Adult Health

## 2022-01-23 ENCOUNTER — Ambulatory Visit: Payer: 59 | Admitting: Adult Health

## 2022-01-23 ENCOUNTER — Other Ambulatory Visit (HOSPITAL_COMMUNITY): Payer: Self-pay

## 2022-01-23 ENCOUNTER — Ambulatory Visit (INDEPENDENT_AMBULATORY_CARE_PROVIDER_SITE_OTHER): Payer: 59

## 2022-01-23 VITALS — BP 100/80 | HR 77 | Temp 98.1°F | Ht 65.5 in | Wt 165.0 lb

## 2022-01-23 DIAGNOSIS — J45909 Unspecified asthma, uncomplicated: Secondary | ICD-10-CM

## 2022-01-23 DIAGNOSIS — J4541 Moderate persistent asthma with (acute) exacerbation: Secondary | ICD-10-CM | POA: Diagnosis not present

## 2022-01-23 LAB — POCT EXHALED NITRIC OXIDE: FeNO level (ppb): 7

## 2022-01-23 MED ORDER — FLUTICASONE FUROATE-VILANTEROL 100-25 MCG/ACT IN AEPB
1.0000 | INHALATION_SPRAY | Freq: Every day | RESPIRATORY_TRACT | 5 refills | Status: DC
  Filled 2022-01-23 – 2022-01-31 (×2): qty 60, 30d supply, fill #0

## 2022-01-23 MED ORDER — ALBUTEROL SULFATE HFA 108 (90 BASE) MCG/ACT IN AERS
2.0000 | INHALATION_SPRAY | Freq: Four times a day (QID) | RESPIRATORY_TRACT | 2 refills | Status: DC | PRN
  Filled 2022-01-23: qty 18, 25d supply, fill #0

## 2022-01-23 MED ORDER — FLUTICASONE FUROATE-VILANTEROL 100-25 MCG/ACT IN AEPB: 1.0000 | INHALATION_SPRAY | Freq: Every day | RESPIRATORY_TRACT | 0 refills | Status: DC

## 2022-01-23 MED ORDER — PREDNISONE 10 MG PO TABS
ORAL_TABLET | ORAL | 0 refills | Status: DC
  Filled 2022-01-23: qty 20, 8d supply, fill #0

## 2022-01-23 MED ORDER — BENZONATATE 200 MG PO CAPS
200.0000 mg | ORAL_CAPSULE | Freq: Three times a day (TID) | ORAL | 1 refills | Status: DC | PRN
  Filled 2022-01-23 – 2022-11-13 (×2): qty 30, 10d supply, fill #0

## 2022-01-23 MED ORDER — ALBUTEROL SULFATE (2.5 MG/3ML) 0.083% IN NEBU
2.5000 mg | INHALATION_SOLUTION | Freq: Four times a day (QID) | RESPIRATORY_TRACT | 12 refills | Status: DC | PRN
  Filled 2022-01-23 – 2022-11-13 (×2): qty 75, 7d supply, fill #0

## 2022-01-23 NOTE — Assessment & Plan Note (Addendum)
Moderate persistent asthma with increased symptom burden. Patient is currently not on any maintenance inhaler.  Will restart ICS/LABA combo.  Spirometry in the office today shows no airflow obstruction or restriction.  Exhaled nitric oxide testing is normal.  We will continue to control for triggers with Zyrtec and Singulair.  Add in cough control with Delsym and Tessalon.  Add in Pepcid for possible GERD component Check chest x-ray today. We will treat for possible exacerbation with course of empiric steroids Asthma action plan discussed in detail.  Plan  Patient Instructions  Prednisone taper over next week.  Begin BREO 1 puff daily  Continue on Singulair and Zyrtec daily Albuterol inhaler or nebulizer as needed Asthma action plan as discussed Chest x-ray today Begin Pepcid '20mg'$  At bedtime   Delsym 2 tsp Twice daily  As needed  cough  Tessalon Three times a day .As needed  cough  Follow up with Tammy Johnson in 6 weeks and As needed   Please contact office for sooner follow up if symptoms do not improve or worsen or seek emergency care

## 2022-01-23 NOTE — Patient Instructions (Addendum)
Prednisone taper over next week.  Begin BREO 1 puff daily  Continue on Singulair and Zyrtec daily Albuterol inhaler or nebulizer as needed Asthma action plan as discussed Chest x-ray today Begin Pepcid '20mg'$  At bedtime   Delsym 2 tsp Twice daily  As needed  cough  Tessalon Three times a day .As needed  cough  Follow up with Dr. Ander Slade in 6 weeks and As needed   Please contact office for sooner follow up if symptoms do not improve or worsen or seek emergency care

## 2022-01-23 NOTE — Progress Notes (Signed)
@Patient  ID: Tammy Johnson, female    DOB: March 17, 1981, 41 y.o.   MRN: [id number]  Chief Complaint  Patient presents with   Consult    Referring provider: Coral Spikes, DO  HPI: 41 yo female former smoker seen for pulmonary consult to establish for Asthma    TEST/EVENTS :   01/23/2022 Pulmonary consult  Patient presents for a pulmonary consult.  She says that she has had longstanding asthma.  Was doing well up until about 1 year ago.  Patient says that she started having increased symptoms with cough and wheezing.  She was taken off of her Advair around December 2022.  Continued on Zyrtec and Singulair.  Patient says over the last 6 months she has had increased episodes of intermittent cough wheezing and shortness of breath.  She has had increased albuterol use.  And currently is out of her albuterol inhaler and nebulizer.  Her nebulizer machine also is broken.  Patient is a former smoker quit in 2015. Feno today 7 ppb  Spirometry today in the office is normal with FEV1 at 91%, ratio 86, FVC 86%. Patient says she did have a COVID-19 infection in 2022 and noticed that her symptoms did worsen after this. She denies any chest pain, orthopnea, edema. No recent chest x-ray.  Patient says she is active but does not exercise on a routine basis.  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  Says the cough is worse sometimes at night and does have some postnasal drainage.  PMH : Breast Cancer 2005- Right breast - lumpectomy (no XRT/Chemo),. 2008 recurrent - Chemo, XRT  Migraines , Asthma , Allergies   SH: Choleystectomy, umbilical hernia repair.   SH : Married. 2 kids (age 2 and 35) , former smoker , 1 PPD x 15 yr , quit 2015.  Social etoh. No drugs. Works for [employer] - she is an RT.    Allergies  Allergen Reactions   Bee Venom Anaphylaxis   Hibiclens [Chlorhexidine Gluconate] Rash   Penicillins Anaphylaxis and Rash    Has patient  had a PCN reaction causing immediate rash, facial/tongue/throat swelling, SOB or lightheadedness with hypotension: Yes Has patient had a PCN reaction causing severe rash involving mucus membranes or skin necrosis: Yes Has patient had a PCN reaction that required hospitalization: Yes Has patient had a PCN reaction occurring within the last 10 years: No If all of the above answers are "NO", then may proceed with Cephalosporin use.    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   Intractable migraine without aura 01/11/2014   Migraine    Migraines 11/13/2013   TB lung, latent 2014   + TB Skin test; Prophalactic Rofampin    Tobacco History: Social History   Tobacco Use  Smoking Status Former   Types: Cigarettes  Quit date: 03/18/2014   Years since quitting: 7.8  Smokeless Tobacco Never   Counseling given: Not Answered   Outpatient Medications Prior to Visit  Medication Sig Dispense Refill   aspirin-acetaminophen-caffeine (EXCEDRIN EXTRA STRENGTH) 250-250-65 MG tablet Take 1 tablet by mouth every 6 (six) hours as needed for headache.     cetirizine (ZYRTEC) 10 MG tablet Take 10 mg by mouth daily as needed for allergies.     EPINEPHrine 0.3 mg/0.3 mL IJ SOAJ injection Inject 0.3 mLs (0.3 mg total) into the muscle once. 1 Device 1   montelukast  (SINGULAIR) 10 MG tablet Take 1 tablet (10 mg total) by mouth at bedtime 90 tablet 1   ondansetron (ZOFRAN) 4 MG tablet Take 1 tablet (4 mg total) by mouth every 6 (six) hours as needed for nausea. 4 tablet 0   SUMAtriptan (IMITREX) 100 MG tablet Take 1 tablet (100 mg total) by mouth once as needed for Migraine for up to 1 dose May take a second dose after 2 hours if needed. 9 tablet 1   HYDROcodone-acetaminophen (NORCO/VICODIN) 5-325 MG tablet Take 1 tablet by mouth every 6 (six) hours as needed for moderate pain. 30 tablet 0   ibuprofen (ADVIL) 800 MG tablet Take 1 tablet (800 mg total) by mouth every 8 (eight) hours as needed. 90 tablet 1   oxyCODONE-acetaminophen (PERCOCET) 5-325 MG tablet Take 1-2 tablets by mouth every 4 (four) hours as needed for moderate pain. (Patient not taking: Reported on 07/18/2019) 25 tablet 0   potassium chloride (KLOR-CON) 20 MEQ packet Take 20 mEq by mouth 2 (two) times daily. (Patient not taking: Reported on 07/18/2019) 10 tablet 0   No facility-administered medications prior to visit.     Review of Systems:   Constitutional:   No  weight loss, night sweats,  Fevers, chills, fatigue, or  lassitude.  HEENT:   No headaches,  Difficulty swallowing,  Tooth/dental problems, or  Sore throat,                No sneezing, itching, ear ache,  +nasal congestion, post nasal drip,   CV:  No chest pain,  Orthopnea, PND, swelling in lower extremities, anasarca, dizziness, palpitations, syncope.   GI  No heartburn, indigestion, abdominal pain, nausea, vomiting, diarrhea, change in bowel habits, loss of appetite, bloody stools.   Resp: .  No chest wall deformity  Skin: no rash or lesions.  GU: no dysuria, change in color of urine, no urgency or frequency.  No flank pain, no hematuria   MS:  No joint pain or swelling.  No decreased range of motion.  No back pain.    Physical Exam  BP 100/80 (BP Location: Left Arm, Patient Position: Sitting, Cuff Size: Large)    Pulse 77   Temp 98.1 F (36.7 C) (Oral)   Ht 5' 5.5" (1.664 m)   Wt 165 lb (74.8 kg)   SpO2 100%   BMI 27.04 kg/m   GEN: A/Ox3; pleasant , NAD, well nourished    HEENT:  [place]/AT,   NOSE-clear, THROAT-clear, no lesions, no postnasal drip or exudate noted.   NECK:  Supple w/ fair ROM; no JVD; normal carotid impulses w/o bruits; no thyromegaly or nodules palpated; no lymphadenopathy.    RESP  Clear  P & A; w/o, wheezes/ rales/ or rhonchi. no accessory muscle use, no dullness to percussion  CARD:  RRR, no m/r/g, no peripheral edema, pulses intact, no cyanosis or clubbing.  GI:   Soft & nt;  nml bowel sounds; no organomegaly or masses detected.   Musco: Warm bil, no deformities or joint swelling noted.   Neuro: alert, no focal deficits noted.    Skin: Warm, no lesions or rashes    Lab Results:  CBC    BNP No results found for: "BNP"  ProBNP No results found for: "PROBNP"  Imaging: No results found.        No data to display          No results found for: "NITRICOXIDE"      Assessment & Plan:   Asthma Moderate persistent asthma with increased symptom burden. Patient is currently not on any maintenance inhaler.  Will restart ICS/LABA combo.  Spirometry in the office today shows no airflow obstruction or restriction.  Exhaled nitric oxide testing is normal.  We will continue to control for triggers with Zyrtec and Singulair.  Add in cough control with Delsym and Tessalon.  Add in Pepcid for possible GERD component Check chest x-ray today. We will treat for possible exacerbation with course of empiric steroids  Plan  Patient Instructions  Prednisone taper over next week.  Begin BREO 1 puff daily  Continue on Singulair and Zyrtec daily Albuterol inhaler or nebulizer as needed Asthma action plan as discussed Chest x-ray today Begin Pepcid 2m At bedtime   Delsym 2 tsp Twice daily  As needed  cough  Tessalon Three times a day .As needed  cough  Follow  up with Dr. OAnder Sladein 6 weeks and As needed   Please contact office for sooner follow up if symptoms do not improve or worsen or seek emergency care          TRexene Edison NP 01/23/2022

## 2022-01-27 ENCOUNTER — Telehealth: Payer: Self-pay | Admitting: Adult Health

## 2022-01-27 NOTE — Telephone Encounter (Signed)
Called and spoke to patient about chest xray results. Nothing further needed

## 2022-01-31 ENCOUNTER — Other Ambulatory Visit (HOSPITAL_COMMUNITY): Payer: Self-pay

## 2022-02-13 ENCOUNTER — Other Ambulatory Visit (HOSPITAL_COMMUNITY): Payer: Self-pay

## 2022-02-14 ENCOUNTER — Telehealth: Payer: Self-pay | Admitting: Adult Health

## 2022-02-14 ENCOUNTER — Other Ambulatory Visit (HOSPITAL_COMMUNITY): Payer: Self-pay

## 2022-02-14 MED ORDER — BUDESONIDE-FORMOTEROL FUMARATE 80-4.5 MCG/ACT IN AERO
2.0000 | INHALATION_SPRAY | Freq: Two times a day (BID) | RESPIRATORY_TRACT | 1 refills | Status: DC
  Filled 2022-02-14: qty 10.2, 30d supply, fill #0

## 2022-02-14 NOTE — Telephone Encounter (Signed)
She called and states that her Memory Dance is not covered by her insurance and is over $100 for a 30 day supply. She was told [name] is preferred by her insurance. Last OV was 01/23/2022 Please advise on preferred inhaler?  Please send back to me per patient request. Thanks.

## 2022-02-14 NOTE — Telephone Encounter (Signed)
I called the patient and she wants to try Symbicort. The patient is aware and it was sent to the [facility] per patient request. Nothing further needed.

## 2022-02-14 NOTE — Telephone Encounter (Signed)
I recommend Symbicort, Dulera or Advair as alternatives can we see if any of these are covered

## 2022-02-17 ENCOUNTER — Telehealth: Payer: Self-pay | Admitting: Pulmonary Disease

## 2022-02-17 NOTE — Telephone Encounter (Signed)
Would you be willing to see this patient next week on the 22? Or another day next week to get established with? Please advise.

## 2022-02-19 NOTE — Telephone Encounter (Signed)
Per AO. Ok to establish patient.

## 2022-02-24 ENCOUNTER — Other Ambulatory Visit (HOSPITAL_COMMUNITY): Payer: Self-pay

## 2022-02-25 ENCOUNTER — Other Ambulatory Visit (HOSPITAL_COMMUNITY): Payer: Self-pay

## 2022-02-25 ENCOUNTER — Ambulatory Visit (INDEPENDENT_AMBULATORY_CARE_PROVIDER_SITE_OTHER): Payer: 59 | Admitting: Pulmonary Disease

## 2022-02-25 ENCOUNTER — Encounter: Payer: Self-pay | Admitting: Pulmonary Disease

## 2022-02-25 VITALS — BP 102/64 | HR 72 | Temp 98.0°F

## 2022-02-25 DIAGNOSIS — J4541 Moderate persistent asthma with (acute) exacerbation: Secondary | ICD-10-CM

## 2022-02-25 MED ORDER — BREZTRI AEROSPHERE 160-9-4.8 MCG/ACT IN AERO
2.0000 | INHALATION_SPRAY | Freq: Two times a day (BID) | RESPIRATORY_TRACT | 3 refills | Status: AC
  Filled 2022-02-25 – 2022-05-28 (×2): qty 10.7, 30d supply, fill #0
  Filled 2022-11-13: qty 10.7, 30d supply, fill #1

## 2022-02-25 MED ORDER — BREZTRI AEROSPHERE 160-9-4.8 MCG/ACT IN AERO: 2.0000 | INHALATION_SPRAY | Freq: Two times a day (BID) | RESPIRATORY_TRACT | 0 refills | Status: AC

## 2022-02-25 NOTE — Addendum Note (Signed)
Addended by: Dessie Coma on: 02/25/2022 03:21 PM   Modules accepted: Orders

## 2022-02-25 NOTE — Progress Notes (Signed)
Tammy Johnson    [id number]    1980/09/16  Primary Care Physician:Cook, Barnie Del, DO  Referring Physician: Coral Spikes, DO [place],   [address]  Chief complaint:   Being seen for asthma  HPI:  Patient with a history of childhood asthma Was having more problems recently Started on Advair, recently used Breo samples Switched over to Symbicort Increased albuterol use Exposure to smoke and dust usually exacerbate symptoms Warm humid matter weather also exacerbates symptoms  Recently had Fino performed which showed a level of 7  She is on Zyrtec, Singulair Did have COVID in 2022, this contributed to worsening symptoms  Past medical history -Has had cholecystectomy  Reformed smoker Less than 15-pack-year smoking history, quit in 2015 Social EtOH use   Outpatient Encounter Medications as of 02/25/2022  Medication Sig   albuterol (PROVENTIL) (2.5 MG/3ML) 0.083% nebulizer solution Inhale 1 vial by nebulization every 6 (six) hours as needed for wheezing or shortness of breath.   albuterol (VENTOLIN HFA) 108 (90 Base) MCG/ACT inhaler Inhale 2 puffs into the lungs every 6 (six) hours as needed for wheezing or shortness of breath.   aspirin-acetaminophen-caffeine (EXCEDRIN EXTRA STRENGTH) 250-250-65 MG tablet Take 1 tablet by mouth every 6 (six) hours as needed for headache.   benzonatate (TESSALON) 200 MG capsule Take 1 capsule (200 mg total) by mouth 3 (three) times daily as needed for cough.   Budeson-Glycopyrrol-Formoterol (BREZTRI AEROSPHERE) 160-9-4.8 MCG/ACT AERO Inhale 2 puffs into the lungs in the morning and at bedtime.   cetirizine (ZYRTEC) 10 MG tablet Take 10 mg by mouth daily as needed for allergies.   Cholecalciferol (VITAMIN D-1000 MAX ST) 25 MCG (1000 UT) tablet Take by mouth.   cyanocobalamin (VITAMIN B12) 1000 MCG tablet Take by mouth.   EPINEPHrine 0.3 mg/0.3 mL IJ SOAJ injection Inject 0.3 mLs (0.3 mg total) into the muscle  once.   montelukast (SINGULAIR) 10 MG tablet Take 1 tablet (10 mg total) by mouth at bedtime   ondansetron (ZOFRAN) 4 MG tablet Take 1 tablet (4 mg total) by mouth every 6 (six) hours as needed for nausea.   SUMAtriptan (IMITREX) 100 MG tablet Take 1 tablet (100 mg total) by mouth once as needed for Migraine for up to 1 dose May take a second dose after 2 hours if needed.   [DISCONTINUED] budesonide-formoterol (SYMBICORT) 80-4.5 MCG/ACT inhaler Inhale 2 puffs into the lungs 2 (two) times daily. (Patient not taking: Reported on 02/25/2022)   [DISCONTINUED] EPINEPHrine 0.3 mg/0.3 mL IJ SOAJ injection Inject 0.3 mLs into the muscle as needed. (Patient not taking: Reported on 02/25/2022)   [DISCONTINUED] predniSONE (DELTASONE) 10 MG tablet Take 4 tablets by mouth once daily for 2 days-- then 3 tablets daily for 2 days-- 2 tablets daily for 2 days-- 1 tablet daily for 2 days (Patient not taking: Reported on 02/25/2022)   No facility-administered encounter medications on file as of 02/25/2022.    Allergies as of 02/25/2022 - Review Complete 02/25/2022  Allergen Reaction Noted   Bee venom Anaphylaxis 10/11/2013   Hibiclens [chlorhexidine gluconate] Rash 01/02/2017   Penicillins Anaphylaxis and Rash 08/24/2011   Clindamycin/lincomycin Hives 05/19/2015   Dapsone Hives, Itching, and Rash 05/19/2015   Latex Swelling and Rash 10/11/2013    Past Medical History:  Diagnosis Date   Abdominal pain 06/27/2015   Asthma    AS A CHILD   Calculus of kidney 10/18/2013   Disease with  a predominantly sexual mode of transmission 10/18/2013   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   Intractable migraine without aura 01/11/2014   Migraine    Migraines 11/13/2013   TB lung, latent 2014   + TB Skin test; Prophalactic Rofampin    Past Surgical History:  Procedure Laterality Date   BREAST EXCISIONAL BIOPSY Right 2005 AND 2008   BENIGN   BREAST SURGERY Right 2005   Lumpectomy   CHOLECYSTECTOMY N/A 12/25/2016    Procedure: LAPAROSCOPIC CHOLECYSTECTOMY;  Surgeon: Florene Glen, MD;  Location: [facility] ORS;  Service: General;  Laterality: N/A;   LAPAROSCOPIC ENDOMETRIOSIS FULGURATION  1999   RT BREAST MASS     UMBILICAL HERNIA REPAIR  12/25/2016   Procedure: HERNIA REPAIR UMBILICAL ADULT;  Surgeon: Florene Glen, MD;  Location: [facility] ORS;  Service: General;;    Family History  Problem Relation Age of Onset   Cervical cancer Maternal Grandmother    Heart disease Maternal Grandmother    Cancer - Colon Maternal Grandmother    Cancer Mother        breast    Depression Brother    Bipolar disorder Brother    Schizophrenia Brother     Social History   Socioeconomic History   Marital status: Married    Spouse name: Not on file   Number of children: Not on file   Years of education: Not on file   Highest education level: Not on file  Occupational History   Not on file  Tobacco Use   Smoking status: Former    Types: Cigarettes    Quit date: 03/18/2014    Years since quitting: 7.9   Smokeless tobacco: Never  Vaping Use   Vaping Use: Never used  Substance and Sexual Activity   Alcohol use: Yes    Comment: Occasional   Drug use: No   Sexual activity: Not on file  Other Topics Concern   Not on file  Social History Narrative   Not on file   Social Determinants of Health   Financial Resource Strain: Not on file  Food Insecurity: Not on file  Transportation Needs: Not on file  Physical Activity: Not on file  Stress: Not on file  Social Connections: Not on file  Intimate Partner Violence: Not on file    Review of Systems  Respiratory:  Positive for shortness of breath.     Vitals:   02/25/22 1416  BP: 102/64  Pulse: 72  Temp: 98 F (36.7 C)  SpO2: 99%     Physical Exam Constitutional:      Appearance: Normal appearance.  HENT:     Head: Normocephalic.  Cardiovascular:     Rate and Rhythm: Normal rate and regular rhythm.     Heart sounds: No murmur heard.    No  friction rub.  Pulmonary:     Effort: No respiratory distress.     Breath sounds: No stridor. No wheezing or rhonchi.  Musculoskeletal:     Cervical back: No rigidity.  Neurological:     Mental Status: She is alert.  Psychiatric:        Mood and Affect: Mood normal.    Recent chest x-ray with no acute infiltrate-01/23/2022  Data Reviewed: Feno recently 7  Assessment:  Moderate persistent asthma  Allergies  Symptoms related to environmental triggers  Plan/Recommendations: Prescription for [facility] sent into pharmacy  Continue albuterol use as needed  Avoid known triggers  Follow-up in 3 months  Recent exacerbation appears better controlled  at present   Sherrilyn Rist MD [facility] 02/25/2022, 2:46 PM  CC: Coral Spikes, DO

## 2022-02-25 NOTE — Patient Instructions (Signed)
Breztri 2 puffs twice a day  Rescue inhaler/nebulizer use as needed  Avoid known triggers  Call with significant concerns  I will see you back in 3 months

## 2022-03-07 ENCOUNTER — Other Ambulatory Visit (HOSPITAL_COMMUNITY): Payer: Self-pay

## 2022-04-30 ENCOUNTER — Other Ambulatory Visit (HOSPITAL_COMMUNITY): Payer: Self-pay

## 2022-04-30 ENCOUNTER — Telehealth: Payer: Self-pay | Admitting: Pulmonary Disease

## 2022-04-30 MED ORDER — PREDNISONE 10 MG PO TABS
ORAL_TABLET | ORAL | 0 refills | Status: AC
  Filled 2022-04-30: qty 15, 10d supply, fill #0

## 2022-04-30 NOTE — Telephone Encounter (Signed)
Spoke with patient. She verbalized understanding. RX has been sent to her pharmacy.

## 2022-04-30 NOTE — Telephone Encounter (Signed)
Spoke with patient. She believes her allergies are bothering her. She is having some chest congestion. She has not been able to produce any phlegm. Denies any chest tightness. She did have some wheezing last night but this resolved after using her albuterol inhaler. She confirmed that she is using Librarian, academic.    Pharmacy is [facility].   Dr. Elsworth Soho, can you please advise? Thanks!

## 2022-05-28 ENCOUNTER — Other Ambulatory Visit (HOSPITAL_BASED_OUTPATIENT_CLINIC_OR_DEPARTMENT_OTHER): Payer: Self-pay

## 2022-08-01 ENCOUNTER — Other Ambulatory Visit (HOSPITAL_BASED_OUTPATIENT_CLINIC_OR_DEPARTMENT_OTHER): Payer: Self-pay

## 2022-08-01 ENCOUNTER — Ambulatory Visit (INDEPENDENT_AMBULATORY_CARE_PROVIDER_SITE_OTHER): Payer: Commercial Managed Care - PPO | Admitting: Pulmonary Disease

## 2022-08-01 ENCOUNTER — Encounter (HOSPITAL_BASED_OUTPATIENT_CLINIC_OR_DEPARTMENT_OTHER): Payer: Self-pay | Admitting: Pulmonary Disease

## 2022-08-01 VITALS — BP 110/64 | HR 90 | Ht 65.5 in | Wt 178.0 lb

## 2022-08-01 DIAGNOSIS — R059 Cough, unspecified: Secondary | ICD-10-CM

## 2022-08-01 LAB — POCT INFLUENZA A/B
Influenza A, POC: NEGATIVE
Influenza B, POC: NEGATIVE

## 2022-08-01 MED ORDER — OSELTAMIVIR PHOSPHATE 75 MG PO CAPS
75.0000 mg | ORAL_CAPSULE | Freq: Two times a day (BID) | ORAL | 0 refills | Status: DC
  Filled 2022-08-01: qty 10, 5d supply, fill #0

## 2022-08-01 MED ORDER — PREDNISONE 10 MG PO TABS
ORAL_TABLET | ORAL | 0 refills | Status: AC
  Filled 2022-08-01: qty 20, 8d supply, fill #0

## 2022-08-01 MED ORDER — AIRSUPRA 90-80 MCG/ACT IN AERO
2.0000 | INHALATION_SPRAY | RESPIRATORY_TRACT | 5 refills | Status: AC | PRN
Start: 2022-08-01 — End: ?
  Filled 2022-08-01: qty 10.7, 30d supply, fill #0
  Filled 2022-11-13: qty 10.7, 30d supply, fill #1

## 2022-08-01 NOTE — Patient Instructions (Addendum)
  Suspected influenza Asthma, not in active exacerbation --Continue Breztri --START Airsupra as needed instead of Albuterol. Coupon card provided --Continue albuterol use as needed if unable to obtain Airsupra --Tamiflu as instructed  Follow-up as needed

## 2022-08-01 NOTE — Progress Notes (Signed)
Tammy Johnson    [id number]    1980/10/23  Primary Care Physician:Cook, Barnie Del, DO  Referring Physician: Coral Spikes, DO [place],  [place] [address]  Chief complaint:   Acute illness  HPI:   42 year old female with asthma with childhood onset who presents for acute visit  Currently on Breztri however in the last 1-2 days developed sore throat, eye pain, cough and shortness of breath requiring increased albuterol. Daughters have influenza B.  Previously on Advair, Symbicort, Breo (samples)  Exposure to smoke and dust usually exacerbate symptoms Warm humid matter weather also exacerbates symptoms  She is on Zyrtec, Singulair Did have COVID in 2022, this contributed to worsening symptoms  Past medical history -Has had cholecystectomy  Reformed smoker Less than 15-pack-year smoking history, quit in 2015 Social EtOH use   Outpatient Encounter Medications as of 08/01/2022  Medication Sig   albuterol (PROVENTIL) (2.5 MG/3ML) 0.083% nebulizer solution Inhale 1 vial by nebulization every 6 (six) hours as needed for wheezing or shortness of breath.   albuterol (VENTOLIN HFA) 108 (90 Base) MCG/ACT inhaler Inhale 2 puffs into the lungs every 6 (six) hours as needed for wheezing or shortness of breath.   Albuterol-Budesonide (AIRSUPRA) 90-80 MCG/ACT AERO Inhale 2 puffs into the lungs every 4 (four) hours as needed.   aspirin-acetaminophen-caffeine (EXCEDRIN EXTRA STRENGTH) 250-250-65 MG tablet Take 1 tablet by mouth every 6 (six) hours as needed for headache.   Budeson-Glycopyrrol-Formoterol (BREZTRI AEROSPHERE) 160-9-4.8 MCG/ACT AERO Inhale 2 puffs into the lungs in the morning and at bedtime.   cetirizine (ZYRTEC) 10 MG tablet Take 10 mg by mouth daily as needed for allergies.   Cholecalciferol (VITAMIN D-1000 MAX ST) 25 MCG (1000 UT) tablet Take by mouth.   cyanocobalamin (VITAMIN B12) 1000 MCG tablet Take by mouth.   EPINEPHrine 0.3 mg/0.3 mL IJ  SOAJ injection Inject 0.3 mLs (0.3 mg total) into the muscle once.   montelukast (SINGULAIR) 10 MG tablet Take 1 tablet (10 mg total) by mouth at bedtime   ondansetron (ZOFRAN) 4 MG tablet Take 1 tablet (4 mg total) by mouth every 6 (six) hours as needed for nausea.   predniSONE (DELTASONE) 10 MG tablet Take 4 tablets (40 mg total) by mouth daily with breakfast for 2 days, THEN 3 tablets (30 mg total) daily with breakfast for 2 days, THEN 2 tablets (20 mg total) daily with breakfast for 2 days, THEN 1 tablet (10 mg total) daily with breakfast for 2 days.   SUMAtriptan (IMITREX) 100 MG tablet Take 1 tablet (100 mg total) by mouth once as needed for Migraine for up to 1 dose May take a second dose after 2 hours if needed.   [DISCONTINUED] oseltamivir (TAMIFLU) 75 MG capsule Take 1 capsule (75 mg total) by mouth 2 (two) times daily.   benzonatate (TESSALON) 200 MG capsule Take 1 capsule (200 mg total) by mouth 3 (three) times daily as needed for cough. (Patient not taking: Reported on 08/01/2022)   Budeson-Glycopyrrol-Formoterol (BREZTRI AEROSPHERE) 160-9-4.8 MCG/ACT AERO Inhale 2 puffs into the lungs in the morning and at bedtime. (Patient not taking: Reported on 08/01/2022)   oseltamivir (TAMIFLU) 75 MG capsule Take 1 capsule (75 mg total) by mouth 2 (two) times daily.   No facility-administered encounter medications on file as of 08/01/2022.    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   Inactive  tuberculosis of lung 10/18/2013   Intractable migraine without aura 01/11/2014   Migraine    Migraines 11/13/2013   TB lung, latent 2014   + TB Skin test; Prophalactic Rofampin    Past Surgical History:  Procedure Laterality Date   BREAST EXCISIONAL BIOPSY Right 2005 AND 2008   BENIGN   BREAST SURGERY Right 2005   Lumpectomy   CHOLECYSTECTOMY N/A 12/25/2016   Procedure: LAPAROSCOPIC CHOLECYSTECTOMY;  Surgeon: Florene Glen, MD;  Location: [facility] ORS;  Service: General;  Laterality: N/A;   LAPAROSCOPIC ENDOMETRIOSIS FULGURATION  1999   RT BREAST MASS     UMBILICAL HERNIA REPAIR  12/25/2016   Procedure: HERNIA REPAIR UMBILICAL ADULT;  Surgeon: Florene Glen, MD;  Location: [facility] ORS;  Service: General;;    Family History  Problem Relation Age of Onset   Cervical cancer Maternal Grandmother    Heart disease Maternal Grandmother    Cancer - Colon Maternal Grandmother    Cancer Mother        breast    Depression Brother    Bipolar disorder Brother    Schizophrenia Brother     Social History   Socioeconomic History   Marital status: Married    Spouse name: Not on file   Number of children: Not on file   Years of education: Not on file   Highest education level: Not on file  Occupational History   Not on file  Tobacco Use   Smoking status: Former    Types: Cigarettes    Quit date: 03/18/2014    Years since quitting: 8.3   Smokeless tobacco: Never  Vaping Use   Vaping Use: Never used  Substance and Sexual Activity   Alcohol use: Yes    Comment: Occasional   Drug use: No   Sexual activity: Not on file  Other Topics Concern   Not on file  Social History Narrative   Not on file   Social Determinants of Health   Financial Resource Strain: Not on file  Food Insecurity: Not on file  Transportation Needs: Not on file  Physical Activity: Not on file  Stress: Not on file  Social Connections: Not on file  Intimate Partner Violence: Not on file    Review of Systems   Constitutional:  Positive for chills. Negative for diaphoresis and fever.  HENT:  Positive for sore throat. Negative for congestion.   Respiratory:  Positive for cough. Negative for shortness of breath and wheezing.   Cardiovascular:  Negative for chest pain, palpitations and leg swelling.    Vitals:   08/01/22 1303  BP: 110/64  Pulse: 90  SpO2: 99%     Physical Exam Physical Exam: General: Well-appearing, no acute distress HENT: , AT Eyes: EOMI, no scleral icterus Respiratory: Clear to auscultation bilaterally.  No crackles, wheezing or rales Cardiovascular: RRR, -M/R/G, no JVD Extremities:-Edema,-tenderness Neuro: AAO x4, CNII-XII grossly intact Psych: Normal mood, normal affect  Recent chest x-ray with no acute infiltrate-01/23/2022  Data Reviewed: Influenza neg  Assessment:  Moderate persistent asthma - not in  exacerbation Viral illness Presumed influenza, recent exposures  Allergies  Symptoms related to environmental triggers  Plan/Recommendations: --Continue Breztri --START Airsupra as needed instead of Albuterol. Coupon card provided --Continue albuterol use as needed if unable to obtain Airsupra --Tamiflu as instructed  Follow-up in 3 months  Rodman Pickle MD  Pulmonary and Critical Care 08/01/2022, 1:54 PM  CC: Coral Spikes, DO

## 2022-09-18 ENCOUNTER — Ambulatory Visit (HOSPITAL_BASED_OUTPATIENT_CLINIC_OR_DEPARTMENT_OTHER)
Admission: RE | Admit: 2022-09-18 | Discharge: 2022-09-18 | Disposition: A | Discharge status: Home / Self Care | Payer: Commercial Managed Care - PPO | Source: Ambulatory Visit | Attending: Student | Admitting: Student

## 2022-09-18 ENCOUNTER — Other Ambulatory Visit (HOSPITAL_BASED_OUTPATIENT_CLINIC_OR_DEPARTMENT_OTHER): Payer: Self-pay | Admitting: Student

## 2022-09-18 DIAGNOSIS — Z1231 Encounter for screening mammogram for malignant neoplasm of breast: Secondary | ICD-10-CM

## 2022-09-22 ENCOUNTER — Other Ambulatory Visit: Payer: Self-pay | Admitting: Student

## 2022-09-22 DIAGNOSIS — R928 Other abnormal and inconclusive findings on diagnostic imaging of breast: Secondary | ICD-10-CM

## 2022-10-03 DIAGNOSIS — R928 Other abnormal and inconclusive findings on diagnostic imaging of breast: Secondary | ICD-10-CM | POA: Diagnosis not present

## 2022-10-03 DIAGNOSIS — R922 Inconclusive mammogram: Secondary | ICD-10-CM | POA: Diagnosis not present

## 2022-11-13 ENCOUNTER — Other Ambulatory Visit (HOSPITAL_BASED_OUTPATIENT_CLINIC_OR_DEPARTMENT_OTHER): Payer: Self-pay

## 2022-11-13 MED ORDER — EPINEPHRINE 0.3 MG/0.3ML IJ SOAJ
0.3000 mg | INTRAMUSCULAR | 1 refills | Status: AC | PRN
  Filled 2022-11-13: qty 2, 30d supply, fill #0

## 2022-11-14 ENCOUNTER — Other Ambulatory Visit: Payer: Self-pay

## 2022-11-15 ENCOUNTER — Ambulatory Visit
Admission: EM | Admit: 2022-11-15 | Discharge: 2022-11-15 | Disposition: A | Discharge status: Home / Self Care | Payer: Commercial Managed Care - PPO

## 2022-11-15 DIAGNOSIS — Z043 Encounter for examination and observation following other accident: Secondary | ICD-10-CM | POA: Diagnosis not present

## 2022-11-15 DIAGNOSIS — S00531A Contusion of lip, initial encounter: Secondary | ICD-10-CM | POA: Diagnosis not present

## 2022-11-15 DIAGNOSIS — S0990XA Unspecified injury of head, initial encounter: Secondary | ICD-10-CM | POA: Diagnosis not present

## 2022-11-15 DIAGNOSIS — S0083XA Contusion of other part of head, initial encounter: Secondary | ICD-10-CM | POA: Diagnosis not present

## 2022-11-15 DIAGNOSIS — S0285XA Fracture of orbit, unspecified, initial encounter for closed fracture: Secondary | ICD-10-CM | POA: Diagnosis not present

## 2022-11-17 DIAGNOSIS — S0231XB Fracture of orbital floor, right side, initial encounter for open fracture: Secondary | ICD-10-CM | POA: Diagnosis not present

## 2022-11-22 ENCOUNTER — Other Ambulatory Visit (HOSPITAL_BASED_OUTPATIENT_CLINIC_OR_DEPARTMENT_OTHER): Payer: Self-pay

## 2022-11-28 DIAGNOSIS — S0231XG Fracture of orbital floor, right side, subsequent encounter for fracture with delayed healing: Secondary | ICD-10-CM | POA: Diagnosis not present

## 2022-11-28 DIAGNOSIS — H538 Other visual disturbances: Secondary | ICD-10-CM | POA: Diagnosis not present

## 2022-11-28 DIAGNOSIS — R42 Dizziness and giddiness: Secondary | ICD-10-CM | POA: Diagnosis not present

## 2022-11-28 DIAGNOSIS — S0231XB Fracture of orbital floor, right side, initial encounter for open fracture: Secondary | ICD-10-CM | POA: Diagnosis not present

## 2022-12-18 DIAGNOSIS — H468 Other optic neuritis: Secondary | ICD-10-CM | POA: Diagnosis not present

## 2022-12-18 DIAGNOSIS — H5340 Unspecified visual field defects: Secondary | ICD-10-CM | POA: Diagnosis not present

## 2022-12-18 DIAGNOSIS — S0231XG Fracture of orbital floor, right side, subsequent encounter for fracture with delayed healing: Secondary | ICD-10-CM | POA: Diagnosis not present

## 2022-12-18 IMAGING — MG DIGITAL DIAGNOSTIC BILAT W/ TOMO W/ CAD
6 of 10 series · 6 of 30 positions shown · non-contrast
Comparison: Previous exams.

CLINICAL DATA: Screening recall for bilateral breast masses.
History of 2 prior benign right breast excisions.



[L MLO synth-2D (1 of 2)]
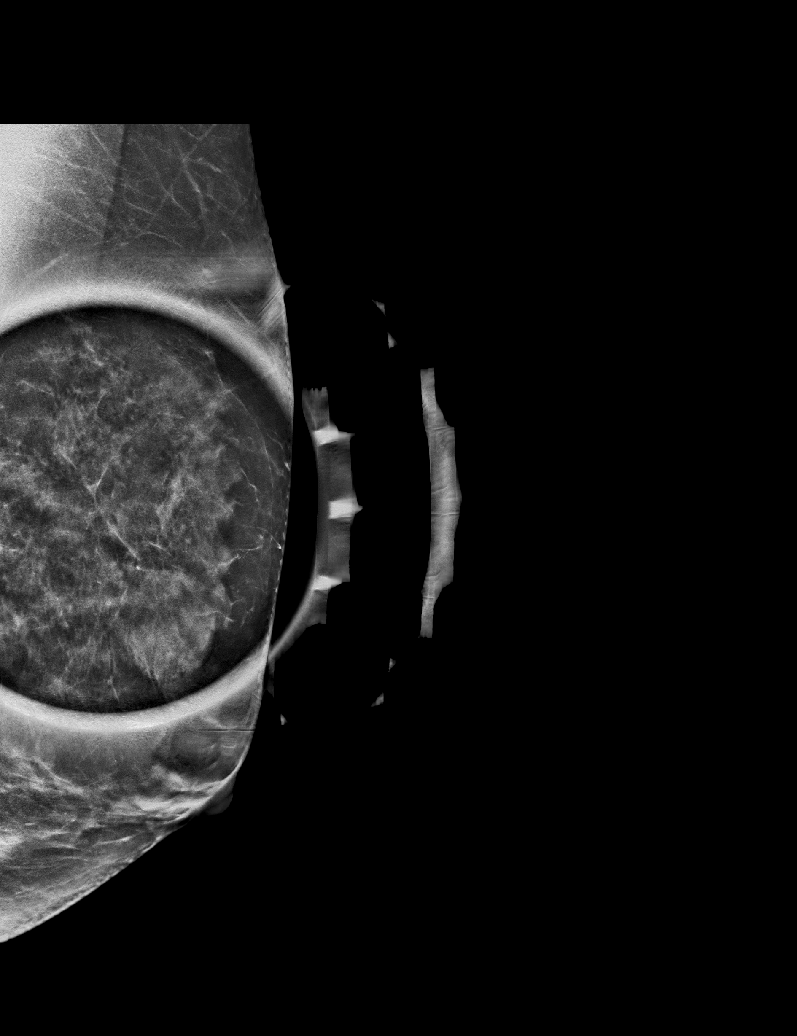

[L MLO synth-2D (2 of 2)]
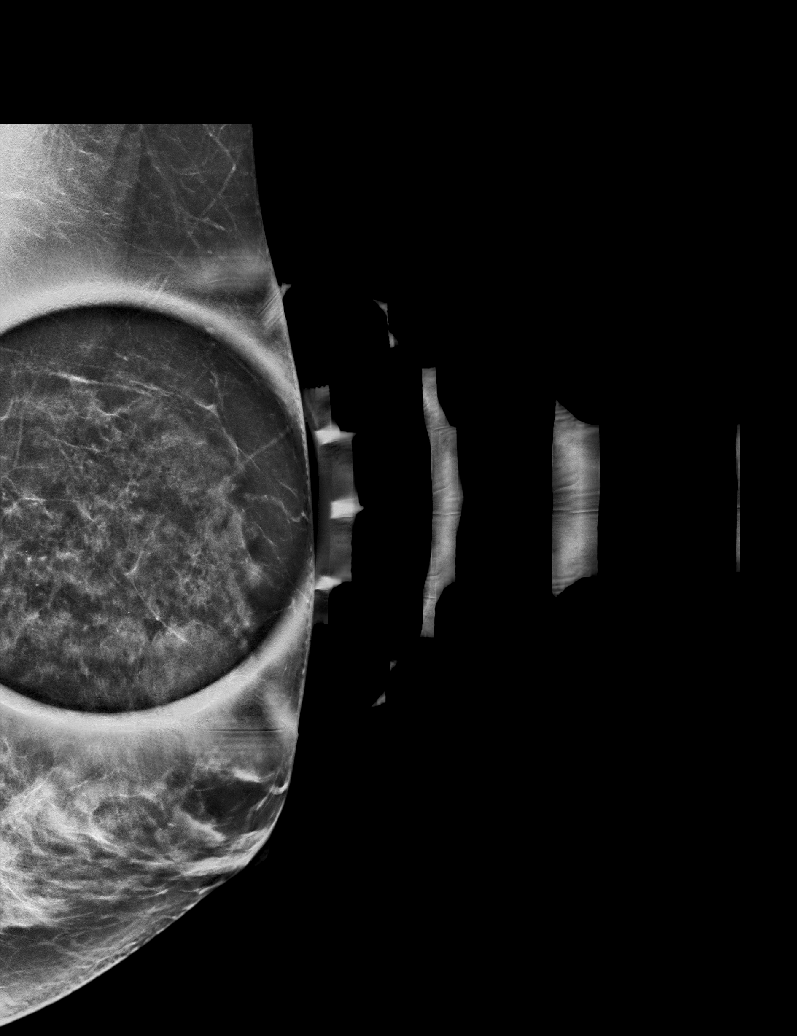

[R ML synth-2D]
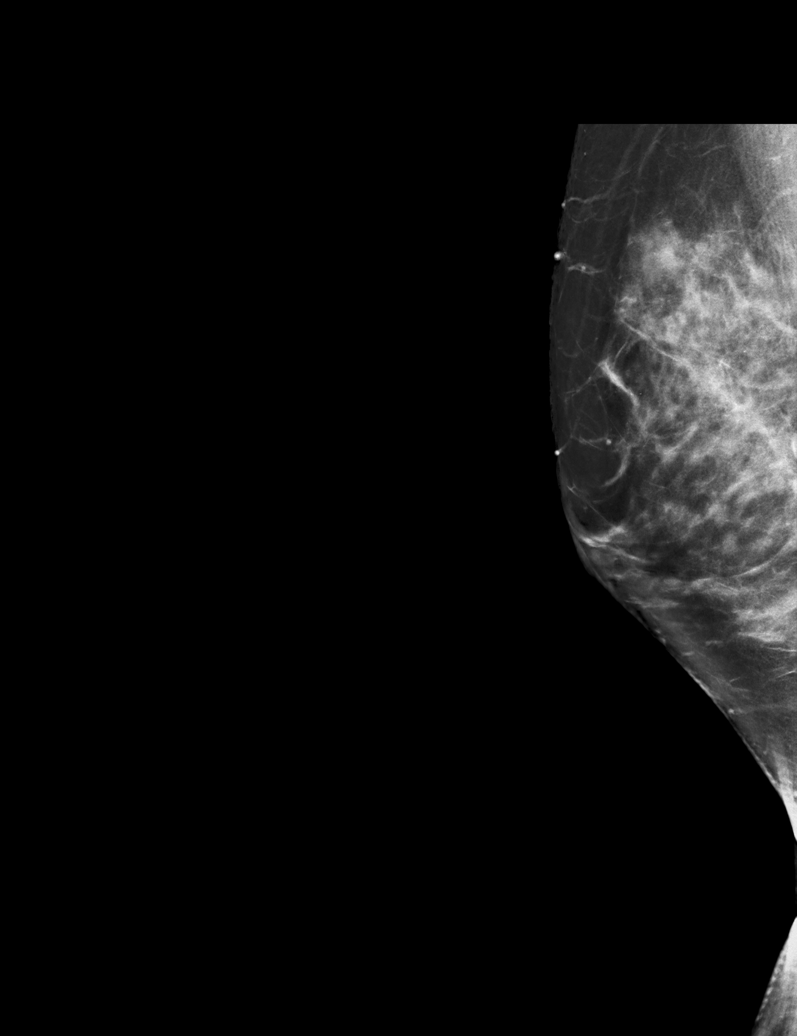

[L CC synth-2D]
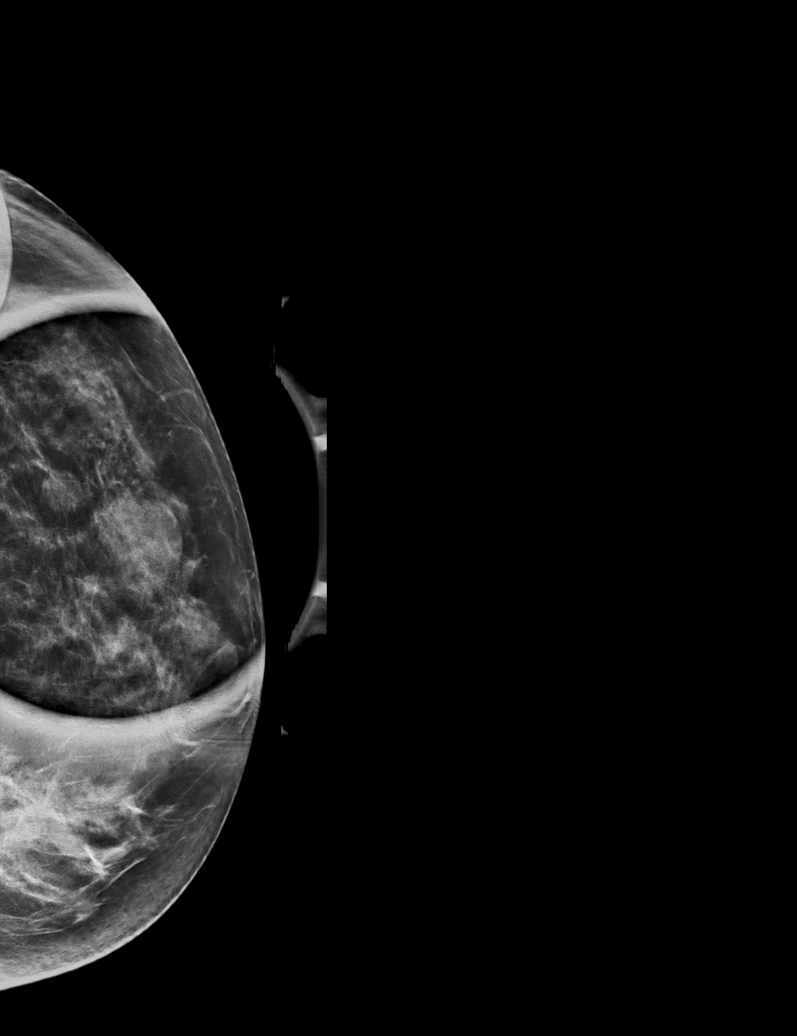

[R MLO synth-2D]
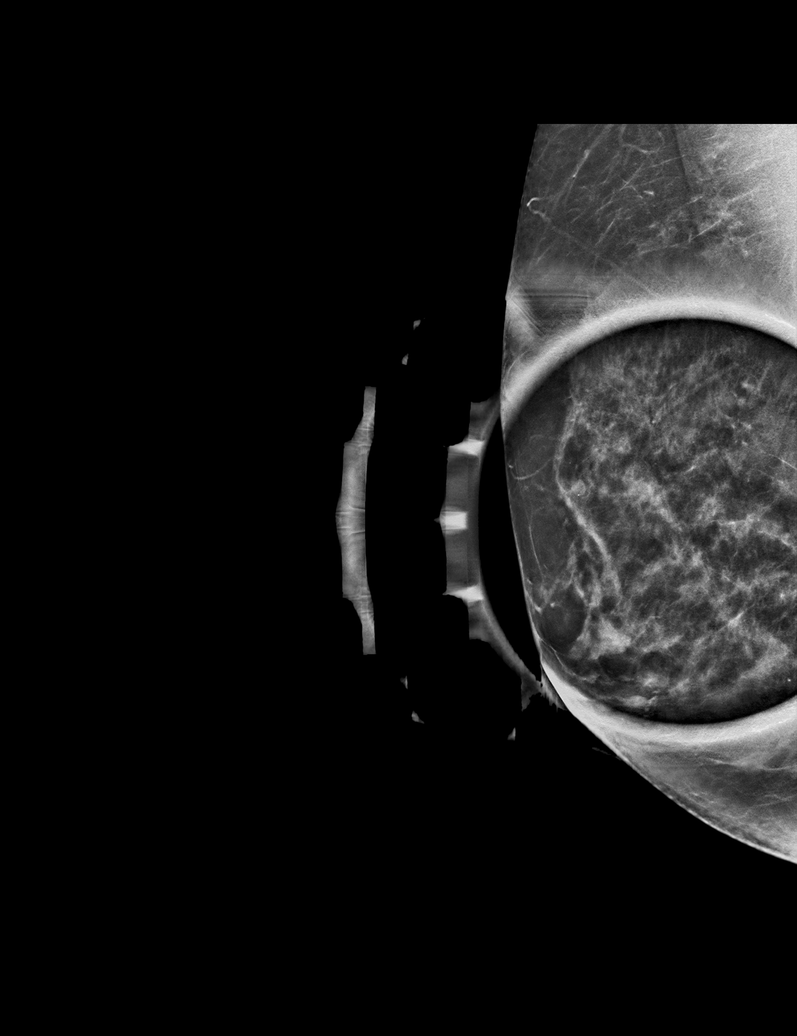

[R MLO tomo · tomo slice 29/58.0]
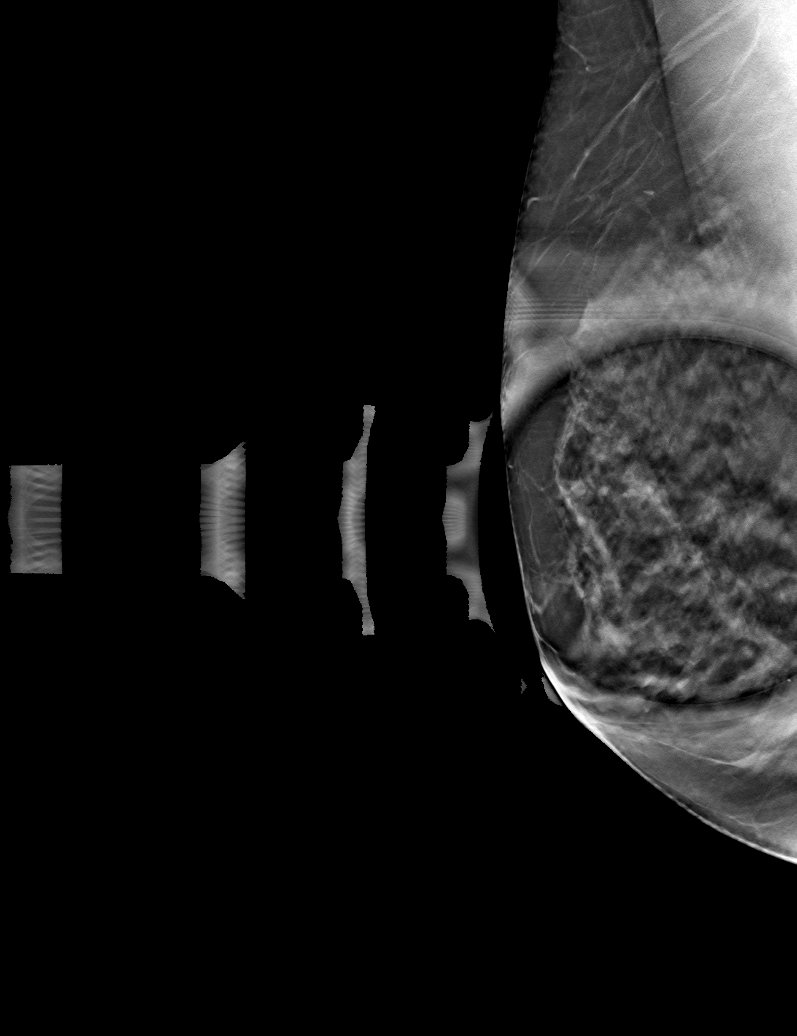

[6 of 30 positions shown; findings below may reference images not displayed]

ACR Breast Density Category c: The breast tissue is heterogeneously
dense, which may obscure small masses.
FINDINGS: Additional tomograms were performed the bilateral breasts. There is
an oval circumscribed mass versus 2 adjacent masses in the outer far
posterior right breast measuring approximately 2 cm. There are
several oval circumscribed masses in the periareolar to outer left
breast, with the largest measuring 2.1 cm.

Targeted ultrasound of the outer right breast was performed
demonstrating multiple cysts. A cyst at 9 o'clock 2 cm from nipple
measures 1.4 x 1 x 1.6 cm. An adjacent cyst also at 9 o'clock 2 cm
from nipple measures 0.9 x 0.8 x 1 cm. These correspond well seen in
the outer right breast at mammography. No suspicious masses or
abnormality seen in the outer right breast.

Targeted ultrasound outer left breast was performed also
demonstrating multiple cysts. A large cyst at 3 o'clock retroareolar
measures 2.4 x 1.5 x 2.2 cm. This corresponds well with the mass
seen in the outer left breast at mammography. No suspicious masses
or abnormality seen in the outer left breast.
IMPRESSION: Bilateral breast cysts.  No findings of malignancy in either breast.

RECOMMENDATION:
Screening mammogram in one year.(Code:ZR-X-MT2)

I have discussed the findings and recommendations with the patient.
If applicable, a reminder letter will be sent to the patient
regarding the next appointment.

BI-RADS CATEGORY  2: Benign.

## 2022-12-18 IMAGING — US US BREAST*L* LIMITED INC AXILLA
1 series · 13 of 15 positions shown · non-contrast
Comparison: Previous exams.

CLINICAL DATA: Screening recall for bilateral breast masses.
History of 2 prior benign right breast excisions.



[Series 1: us breast*left* limited inc axilla · 0.07mm/px · 13 of 15 slices shown]
[im 1/15]
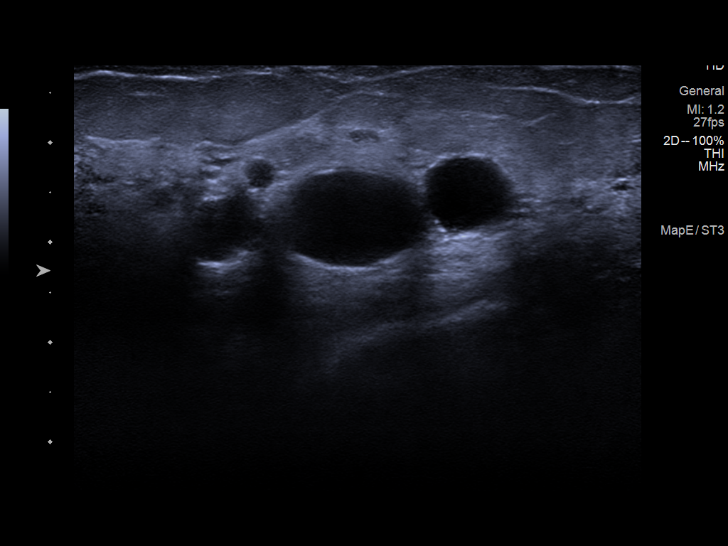
[im 2/15]
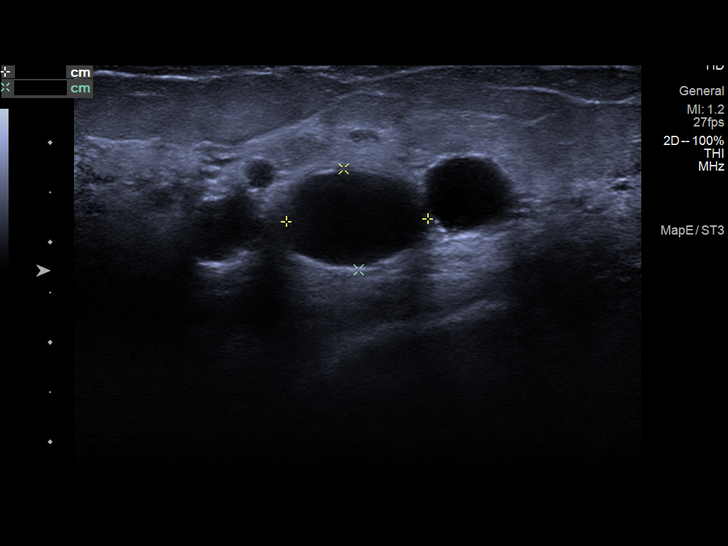
[im 3/15]
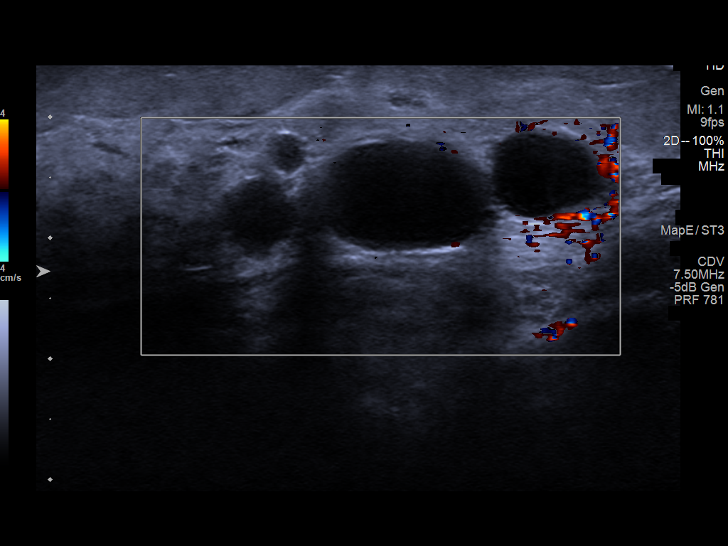
[im 5/15]
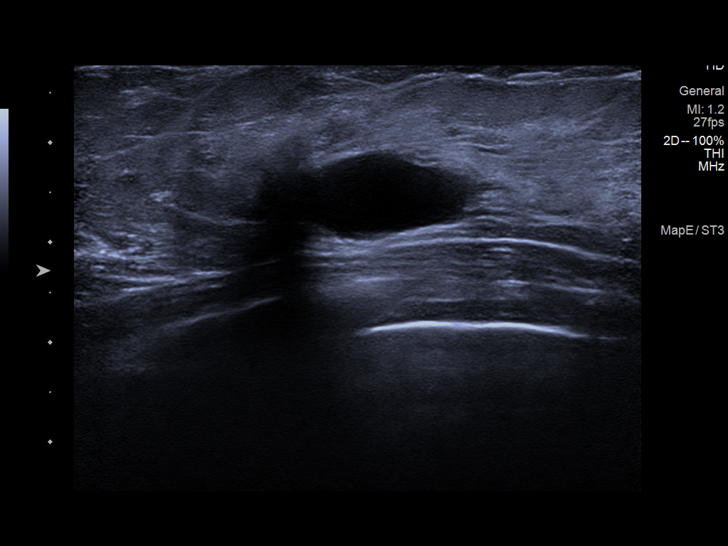
[im 6/15]
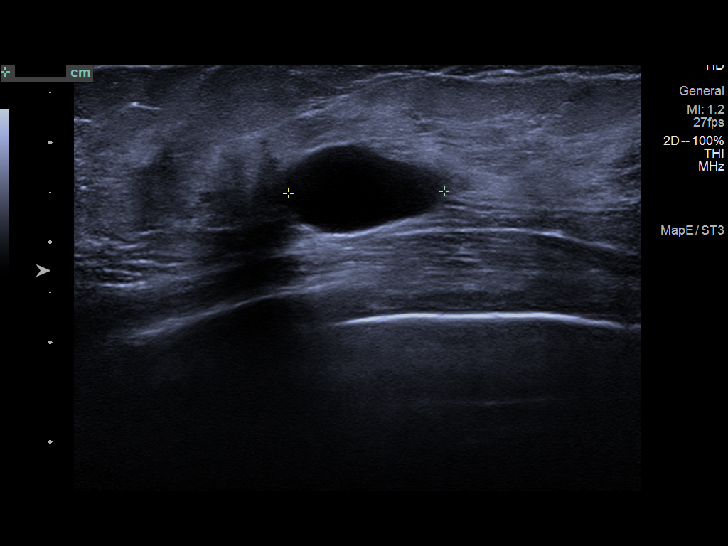
[im 7/15]
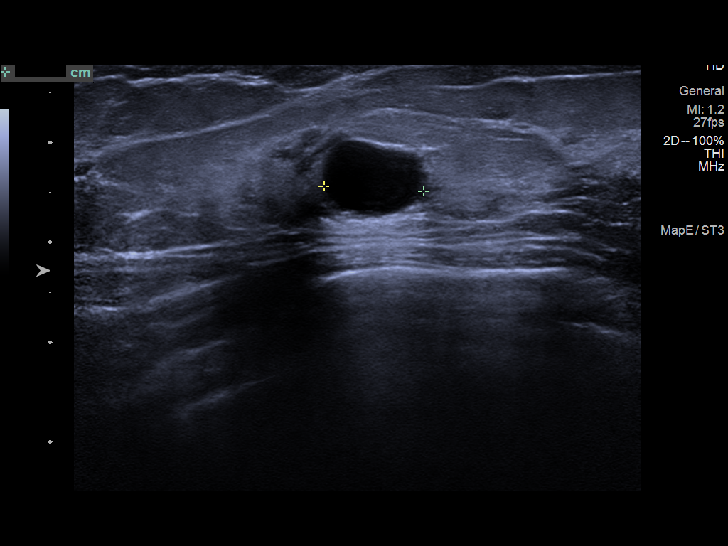
[im 8/15]
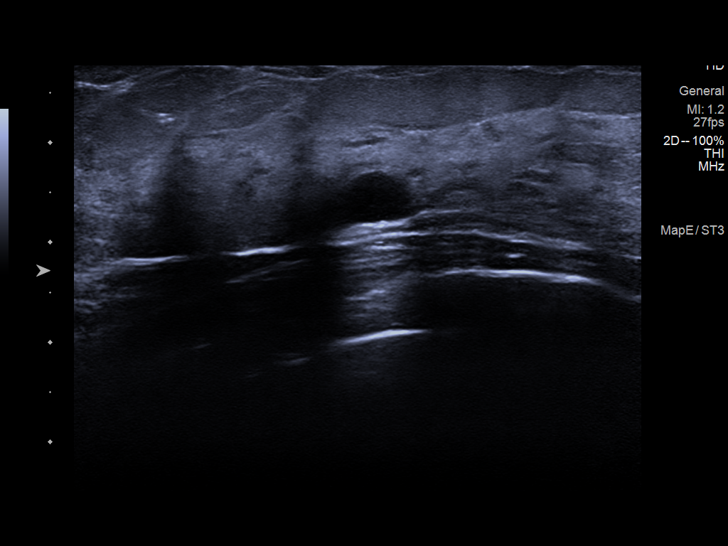
[im 9/15]
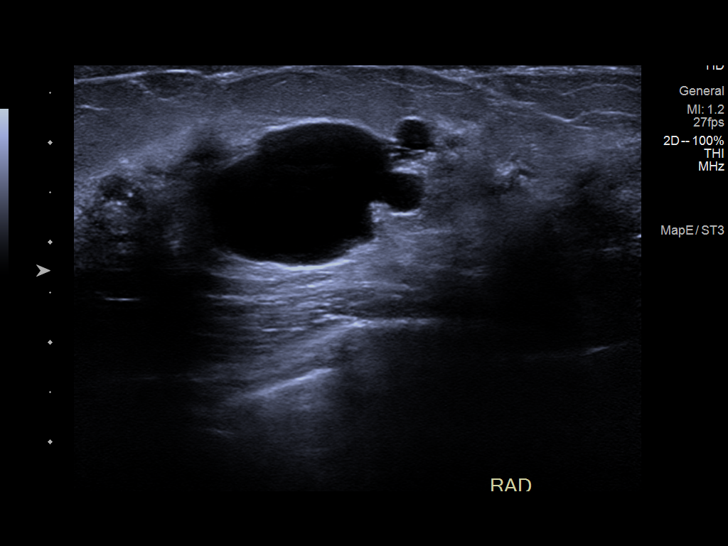
[im 10/15]
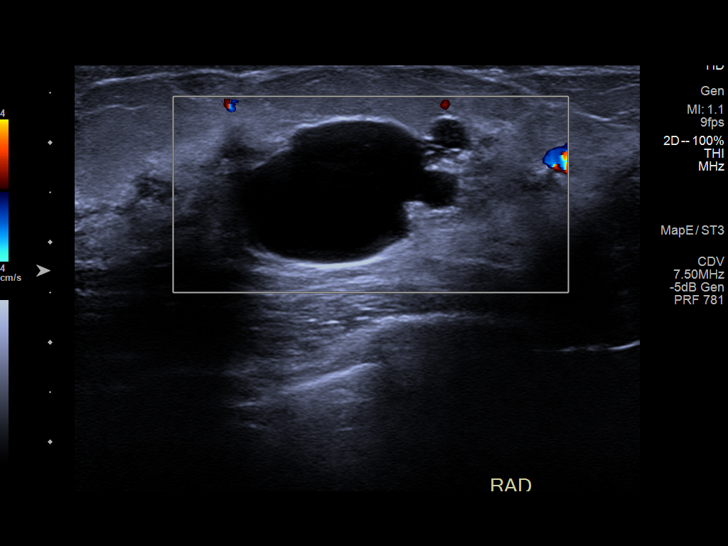
[im 11/15]
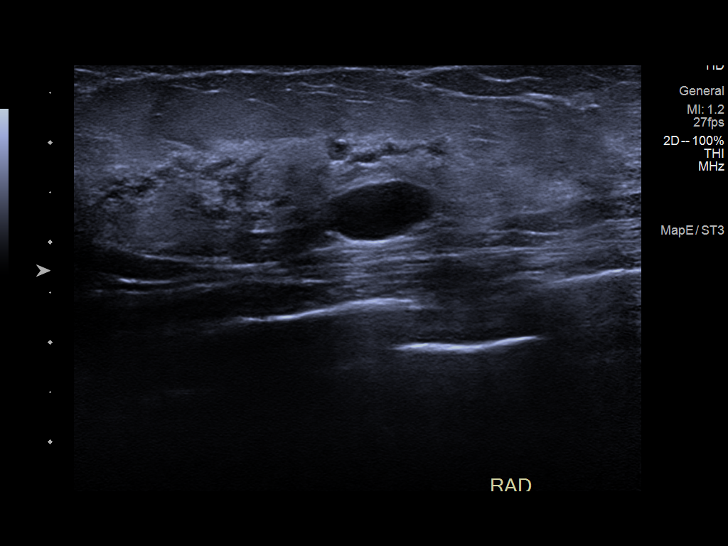
[im 13/15]
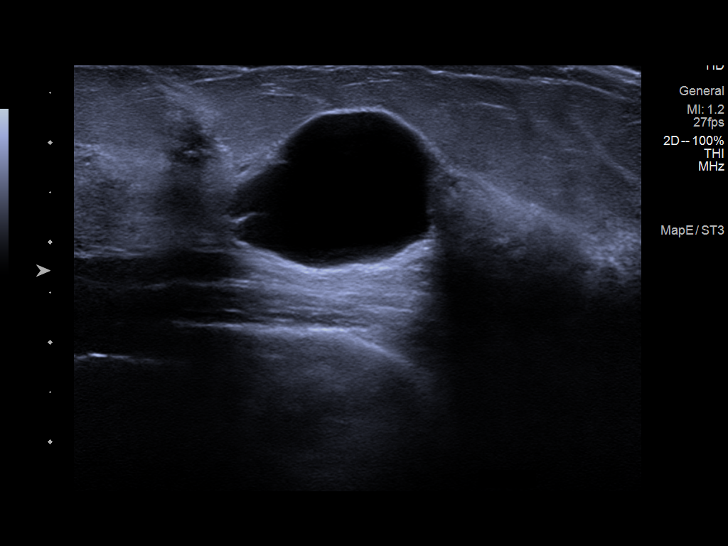
[im 14/15]
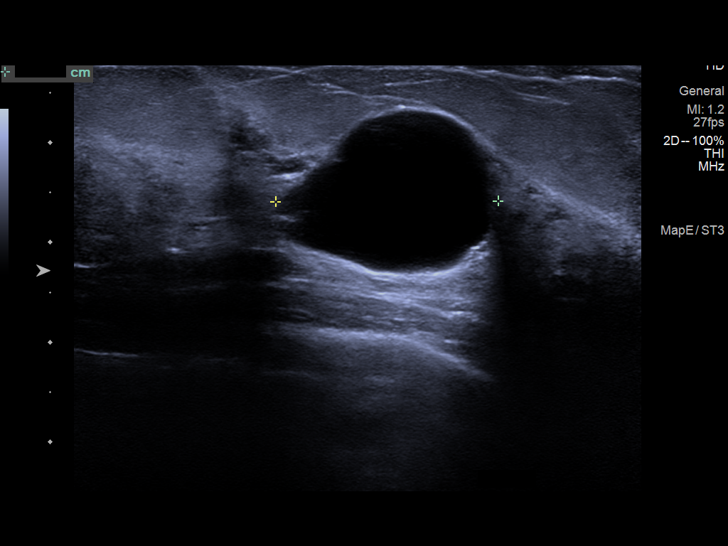
[im 15/15]
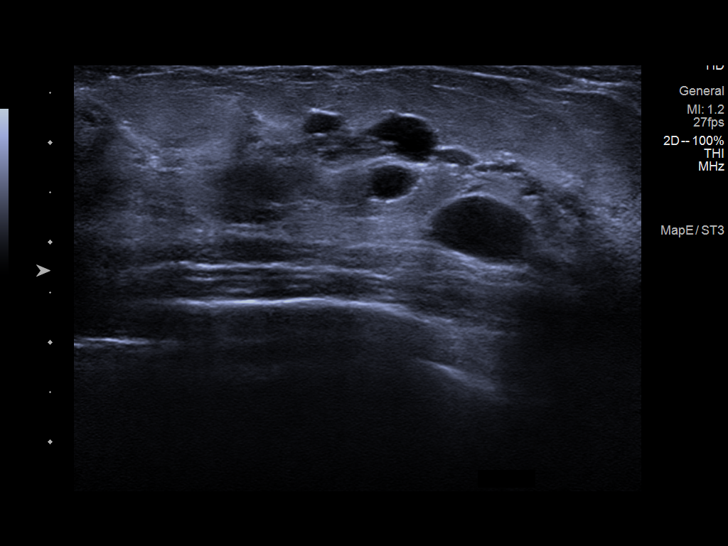

[13 of 15 positions shown; findings below may reference images not displayed]

ACR Breast Density Category c: The breast tissue is heterogeneously
dense, which may obscure small masses.
FINDINGS: Additional tomograms were performed the bilateral breasts. There is
an oval circumscribed mass versus 2 adjacent masses in the outer far
posterior right breast measuring approximately 2 cm. There are
several oval circumscribed masses in the periareolar to outer left
breast, with the largest measuring 2.1 cm.

Targeted ultrasound of the outer right breast was performed
demonstrating multiple cysts. A cyst at 9 o'clock 2 cm from nipple
measures 1.4 x 1 x 1.6 cm. An adjacent cyst also at 9 o'clock 2 cm
from nipple measures 0.9 x 0.8 x 1 cm. These correspond well seen in
the outer right breast at mammography. No suspicious masses or
abnormality seen in the outer right breast.

Targeted ultrasound outer left breast was performed also
demonstrating multiple cysts. A large cyst at 3 o'clock retroareolar
measures 2.4 x 1.5 x 2.2 cm. This corresponds well with the mass
seen in the outer left breast at mammography. No suspicious masses
or abnormality seen in the outer left breast.
IMPRESSION: Bilateral breast cysts.  No findings of malignancy in either breast.

RECOMMENDATION:
Screening mammogram in one year.(Code:ZR-X-MT2)

I have discussed the findings and recommendations with the patient.
If applicable, a reminder letter will be sent to the patient
regarding the next appointment.

BI-RADS CATEGORY  2: Benign.

## 2023-01-02 DIAGNOSIS — Z13220 Encounter for screening for lipoid disorders: Secondary | ICD-10-CM | POA: Diagnosis not present

## 2023-01-02 DIAGNOSIS — G4700 Insomnia, unspecified: Secondary | ICD-10-CM | POA: Diagnosis not present

## 2023-01-02 DIAGNOSIS — Z87891 Personal history of nicotine dependence: Secondary | ICD-10-CM | POA: Diagnosis not present

## 2023-01-02 DIAGNOSIS — F419 Anxiety disorder, unspecified: Secondary | ICD-10-CM | POA: Diagnosis not present

## 2023-01-02 DIAGNOSIS — Z0000 Encounter for general adult medical examination without abnormal findings: Secondary | ICD-10-CM | POA: Diagnosis not present

## 2023-01-02 DIAGNOSIS — J4540 Moderate persistent asthma, uncomplicated: Secondary | ICD-10-CM | POA: Diagnosis not present

## 2023-01-02 DIAGNOSIS — C50911 Malignant neoplasm of unspecified site of right female breast: Secondary | ICD-10-CM | POA: Diagnosis not present

## 2023-01-02 DIAGNOSIS — Z1331 Encounter for screening for depression: Secondary | ICD-10-CM | POA: Diagnosis not present

## 2023-01-02 DIAGNOSIS — N6001 Solitary cyst of right breast: Secondary | ICD-10-CM | POA: Diagnosis not present

## 2023-01-02 DIAGNOSIS — G43009 Migraine without aura, not intractable, without status migrainosus: Secondary | ICD-10-CM | POA: Diagnosis not present

## 2023-01-02 DIAGNOSIS — Z853 Personal history of malignant neoplasm of breast: Secondary | ICD-10-CM | POA: Diagnosis not present

## 2023-01-02 DIAGNOSIS — Z131 Encounter for screening for diabetes mellitus: Secondary | ICD-10-CM | POA: Diagnosis not present

## 2023-01-15 DIAGNOSIS — L910 Hypertrophic scar: Secondary | ICD-10-CM | POA: Diagnosis not present

## 2023-02-20 DIAGNOSIS — H5340 Unspecified visual field defects: Secondary | ICD-10-CM | POA: Diagnosis not present

## 2023-02-20 DIAGNOSIS — H468 Other optic neuritis: Secondary | ICD-10-CM | POA: Diagnosis not present

## 2023-02-26 DIAGNOSIS — H468 Other optic neuritis: Secondary | ICD-10-CM | POA: Diagnosis not present

## 2023-04-02 DIAGNOSIS — H468 Other optic neuritis: Secondary | ICD-10-CM | POA: Diagnosis not present

## 2023-04-03 ENCOUNTER — Other Ambulatory Visit: Payer: Self-pay | Admitting: Oculoplastics Ophthalmology

## 2023-04-03 DIAGNOSIS — H468 Other optic neuritis: Secondary | ICD-10-CM

## 2023-04-10 ENCOUNTER — Ambulatory Visit
Admission: RE | Admit: 2023-04-10 | Discharge: 2023-04-10 | Disposition: A | Discharge status: Home / Self Care | Payer: Commercial Managed Care - PPO | Source: Ambulatory Visit | Attending: Oculoplastics Ophthalmology | Admitting: Oculoplastics Ophthalmology

## 2023-04-10 DIAGNOSIS — H0530 Unspecified deformity of orbit: Secondary | ICD-10-CM | POA: Diagnosis not present

## 2023-04-10 DIAGNOSIS — H468 Other optic neuritis: Secondary | ICD-10-CM | POA: Insufficient documentation

## 2023-04-10 MED ORDER — GADOBUTROL 1 MMOL/ML IV SOLN
7.5000 mL | Freq: Once | INTRAVENOUS | Status: AC | PRN
  Administered 2023-04-10: 7.5 mL via INTRAVENOUS

## 2023-04-24 ENCOUNTER — Other Ambulatory Visit (HOSPITAL_BASED_OUTPATIENT_CLINIC_OR_DEPARTMENT_OTHER): Payer: Self-pay

## 2023-04-24 MED ORDER — FLULAVAL 0.5 ML IM SUSY
0.5000 mL | PREFILLED_SYRINGE | Freq: Once | INTRAMUSCULAR | 0 refills | Status: AC
  Filled 2023-04-24: qty 0.5, 1d supply, fill #0

## 2023-08-13 ENCOUNTER — Other Ambulatory Visit: Payer: Self-pay | Admitting: Adult Health

## 2023-08-14 ENCOUNTER — Other Ambulatory Visit (HOSPITAL_BASED_OUTPATIENT_CLINIC_OR_DEPARTMENT_OTHER): Payer: Self-pay

## 2023-08-14 MED ORDER — BENZONATATE 200 MG PO CAPS
200.0000 mg | ORAL_CAPSULE | Freq: Three times a day (TID) | ORAL | 1 refills | Status: DC | PRN
  Filled 2023-08-14: qty 30, 10d supply, fill #0

## 2023-08-14 NOTE — Telephone Encounter (Signed)
 Please advise if ok to refill her tessalon , thanks.

## 2023-09-06 ENCOUNTER — Ambulatory Visit
Admission: EM | Admit: 2023-09-06 | Discharge: 2023-09-06 | Disposition: A | Discharge status: Home / Self Care | Attending: Nurse Practitioner | Admitting: Nurse Practitioner

## 2023-09-06 ENCOUNTER — Ambulatory Visit (HOSPITAL_COMMUNITY)
Admission: RE | Admit: 2023-09-06 | Discharge: 2023-09-06 | Disposition: A | Discharge status: Home / Self Care | Source: Ambulatory Visit | Attending: Nurse Practitioner | Admitting: Nurse Practitioner

## 2023-09-06 ENCOUNTER — Telehealth: Payer: Self-pay | Admitting: Nurse Practitioner

## 2023-09-06 ENCOUNTER — Encounter: Payer: Self-pay | Admitting: Emergency Medicine

## 2023-09-06 DIAGNOSIS — R0602 Shortness of breath: Secondary | ICD-10-CM | POA: Insufficient documentation

## 2023-09-06 DIAGNOSIS — R918 Other nonspecific abnormal finding of lung field: Secondary | ICD-10-CM | POA: Diagnosis not present

## 2023-09-06 DIAGNOSIS — J111 Influenza due to unidentified influenza virus with other respiratory manifestations: Secondary | ICD-10-CM | POA: Diagnosis not present

## 2023-09-06 DIAGNOSIS — Z8709 Personal history of other diseases of the respiratory system: Secondary | ICD-10-CM

## 2023-09-06 DIAGNOSIS — B349 Viral infection, unspecified: Secondary | ICD-10-CM

## 2023-09-06 DIAGNOSIS — R059 Cough, unspecified: Secondary | ICD-10-CM | POA: Insufficient documentation

## 2023-09-06 DIAGNOSIS — Z227 Latent tuberculosis: Secondary | ICD-10-CM

## 2023-09-06 LAB — POC COVID19/FLU A&B COMBO
Covid Antigen, POC: NEGATIVE
Influenza A Antigen, POC: NEGATIVE
Influenza B Antigen, POC: NEGATIVE

## 2023-09-06 MED ORDER — ALBUTEROL SULFATE HFA 108 (90 BASE) MCG/ACT IN AERS: 2.0000 | INHALATION_SPRAY | Freq: Four times a day (QID) | RESPIRATORY_TRACT | 0 refills | Status: AC | PRN

## 2023-09-06 MED ORDER — OSELTAMIVIR PHOSPHATE 75 MG PO CAPS: 75.0000 mg | ORAL_CAPSULE | Freq: Two times a day (BID) | ORAL | 0 refills | Status: DC

## 2023-09-06 MED ORDER — PREDNISONE 20 MG PO TABS
40.0000 mg | ORAL_TABLET | Freq: Every day | ORAL | 0 refills | Status: AC
  Filled 2023-09-06 – 2023-09-07 (×2): qty 10, 5d supply, fill #0

## 2023-09-06 MED ORDER — PROMETHAZINE-DM 6.25-15 MG/5ML PO SYRP: 5.0000 mL | ORAL_SOLUTION | Freq: Four times a day (QID) | ORAL | 0 refills | Status: AC | PRN

## 2023-09-06 MED ORDER — ALBUTEROL SULFATE (2.5 MG/3ML) 0.083% IN NEBU: 2.5000 mg | INHALATION_SOLUTION | Freq: Four times a day (QID) | RESPIRATORY_TRACT | 0 refills | Status: AC | PRN

## 2023-09-06 MED ORDER — FLUTICASONE PROPIONATE 50 MCG/ACT NA SUSP: 2.0000 | Freq: Every day | NASAL | 0 refills | Status: AC

## 2023-09-06 NOTE — Discharge Instructions (Addendum)
 The COVID/flu test was negative.  Despite the negative test result, you are being treated with Tamiflu. Take medication as prescribed. Increase fluids and allow for plenty of rest. May take over-the-counter Tylenol or ibuprofen as needed for pain, fever, or general discomfort. Recommend use of a humidifier at nighttime during sleep and sleeping elevated on pillows while cough symptoms persist. May use normal saline nasal spray throughout the day for nasal congestion and runny nose. You should remain home until you have been fever free for 24 hours with no medication. Symptoms should improve over the next 5 to 7 days.  If symptoms fail to improve, or appear to be worsening, you may follow-up in this clinic or with your primary care physician for further evaluation. Follow-up as needed.

## 2023-09-06 NOTE — ED Triage Notes (Signed)
 Cough, headache, body aches, and fever since Friday.   Needs refill on albuterol inhaler

## 2023-09-06 NOTE — ED Provider Notes (Signed)
 [facility]    CSN: [id number] Arrival date & time: 09/06/23  1056      History   Chief Complaint No chief complaint on file.   HPI Tammy Johnson is a 43 y.o. female.   The history is provided by the patient.   Patient presents with a 2-day history of cough, wheezing, body aches, fever, and chills.  Tmax around 100-101.  Denies ear pain, sore throat, difficulty breathing, chest pain, abdominal pain, nausea, vomiting, diarrhea, or rash.  Patient states that the cough has been persistent.  States that she feels like there is a "air pocket" in her left lung field when she is coughing.  Patient states she has been taking over-the-counter medications for her symptoms, states that she used her albuterol nebulizer for her cough.  Per review of patient's chart, patient with underlying history of asthma and an active TB.  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   Intractable migraine without aura 01/11/2014   Migraine    Migraines 11/13/2013   TB lung, latent 2014   + TB Skin test; Prophalactic Rofampin    Patient Active Problem List   Diagnosis Date Noted   Asthma 01/23/2022   Closed nondisplaced fracture of cuboid bone of right foot 08/08/2019   Calculus of gallbladder with chronic cholecystitis without obstruction    Umbilical hernia without obstruction and without gangrene    Abdominal pain 06/27/2015   Intractable migraine without aura 01/11/2014   Migraines 11/13/2013   Malignant neoplasm of breast (HCC) 10/18/2013   Calculus of kidney 10/18/2013   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    Past Surgical History:  Procedure Laterality Date   BREAST EXCISIONAL BIOPSY Right 2005 AND 2008   BENIGN    BREAST SURGERY Right 2005   Lumpectomy   CHOLECYSTECTOMY N/A 12/25/2016   Procedure: LAPAROSCOPIC CHOLECYSTECTOMY;  Surgeon: Lattie Haw, MD;  Location: [facility];  Service: General;  Laterality: N/A;   LAPAROSCOPIC ENDOMETRIOSIS FULGURATION  1999   RT BREAST MASS     UMBILICAL HERNIA REPAIR  12/25/2016   Procedure: HERNIA REPAIR UMBILICAL ADULT;  Surgeon: Lattie Haw, MD;  Location: [facility];  Service: General;;    OB History     Gravida  2   Para  2   Term  2   Preterm      AB      Living  2      SAB      IAB      Ectopic      Multiple      Live Births               Home Medications    Prior to Admission medications   Medication Sig Start Date End Date Taking? Authorizing Provider  albuterol (PROVENTIL) (2.5 MG/3ML) 0.083% nebulizer solution Take 3 mLs (2.5 mg total) by nebulization every 6 (six) hours as needed for wheezing or shortness of breath. 09/06/23  Yes Leath-Warren, Sadie Haber, NP  albuterol (VENTOLIN HFA) 108 (90 Base) MCG/ACT inhaler Inhale 2 puffs into the lungs every 6 (six) hours as needed. 09/06/23  Yes Leath-Warren, Sadie Haber, NP  fluticasone (FLONASE) 50 MCG/ACT nasal spray Place 2  sprays into both nostrils daily. 09/06/23  Yes Leath-Warren, Sadie Haber, NP  oseltamivir (TAMIFLU) 75 MG capsule Take 1 capsule (75 mg total) by mouth every 12 (twelve) hours. 09/06/23  Yes Leath-Warren, Sadie Haber, NP  promethazine-dextromethorphan (PROMETHAZINE-DM) 6.25-15 MG/5ML syrup Take 5 mLs by mouth 4 (four) times daily as needed. 09/06/23  Yes Leath-Warren, Sadie Haber, NP  Albuterol-Budesonide (AIRSUPRA) 90-80 MCG/ACT AERO Inhale 2 puffs into the lungs every 4 (four) hours as needed. 08/01/22   Luciano Cutter, MD  aspirin-acetaminophen-caffeine (EXCEDRIN EXTRA STRENGTH) [phone] MG tablet Take 1 tablet by mouth every 6 (six) hours as needed for headache.    [provider]  Budeson-Glycopyrrol-Formoterol (BREZTRI AEROSPHERE) 160-9-4.8 MCG/ACT  AERO Inhale 2 puffs into the lungs in the morning and at bedtime. 02/25/22   Olalere, Minna Antis, MD  Budeson-Glycopyrrol-Formoterol (BREZTRI AEROSPHERE) 160-9-4.8 MCG/ACT AERO Inhale 2 puffs into the lungs in the morning and at bedtime. Patient not taking: Reported on 08/01/2022 02/25/22   Tomma Lightning, MD  cetirizine (ZYRTEC) 10 MG tablet Take 10 mg by mouth daily as needed for allergies.    [provider]  Cholecalciferol (VITAMIN D-1000 MAX ST) 25 MCG (1000 UT) tablet Take by mouth.    [provider]  cyanocobalamin (VITAMIN B12) 1000 MCG tablet Take by mouth.    [provider]  EPINEPHrine 0.3 mg/0.3 mL IJ SOAJ injection Inject 0.3 mLs (0.3 mg total) into the muscle once. 04/10/15   Gerhard Munch, MD  EPINEPHrine 0.3 mg/0.3 mL IJ SOAJ injection Inject 0.3 mLs (0.3 mg total) into the muscle as needed for Anaphylaxis 11/13/22     ondansetron (ZOFRAN) 4 MG tablet Take 1 tablet (4 mg total) by mouth every 6 (six) hours as needed for nausea. 11/16/16   Dione Booze, MD  SUMAtriptan (IMITREX) 100 MG tablet Take 1 tablet (100 mg total) by mouth once as needed for Migraine for up to 1 dose May take a second dose after 2 hours if needed. 12/30/21       Family History Family History  Problem Relation Age of Onset   Cervical cancer Maternal Grandmother    Heart disease Maternal Grandmother    Cancer - Colon Maternal Grandmother    Cancer Mother        breast    Depression Brother    Bipolar disorder Brother    Schizophrenia Brother     Social History Social History   Tobacco Use   Smoking status: Former    Current packs/day: 0.00    Types: Cigarettes    Quit date: 03/18/2014    Years since quitting: 9.4   Smokeless tobacco: Never  Vaping Use   Vaping status: Never Used  Substance Use Topics   Alcohol use: Yes    Comment: Occasional   Drug use: No     Allergies   Bee venom, Hibiclens [chlorhexidine gluconate], Penicillins, Clindamycin/lincomycin,  Dapsone, and Latex   Review of Systems Review of Systems Per HPI  Physical Exam Triage Vital Signs ED Triage Vitals  Encounter Vitals Group     BP 09/06/23 1121 116/80     Systolic BP Percentile --      Diastolic BP Percentile --      Pulse Rate 09/06/23 1121 (!) 106     Resp 09/06/23 1121 18     Temp 09/06/23 1121 99 F (37.2 C)     Temp Source 09/06/23 1121 Oral     SpO2 09/06/23 1121 96 %  Weight --      Height --      Head Circumference --      Peak Flow --      Pain Score 09/06/23 1123 4     Pain Loc --      Pain Education --      Exclude from Growth Chart --    No data found.  Updated Vital Signs BP 116/80 (BP Location: Right Arm)   Pulse (!) 106   Temp 99 F (37.2 C) (Oral)   Resp 18   LMP 08/17/2023 (Approximate)   SpO2 96%   Visual Acuity Right Eye Distance:   Left Eye Distance:   Bilateral Distance:    Right Eye Near:   Left Eye Near:    Bilateral Near:     Physical Exam Vitals and nursing note reviewed.  Constitutional:      General: She is not in acute distress.    Appearance: Normal appearance.  HENT:     Head: Normocephalic.     Right Ear: Tympanic membrane, ear canal and external ear normal.     Left Ear: Tympanic membrane, ear canal and external ear normal.     Nose: Congestion present.     Right Turbinates: Enlarged and swollen.     Left Turbinates: Enlarged and swollen.     Right Sinus: No maxillary sinus tenderness or frontal sinus tenderness.     Left Sinus: No maxillary sinus tenderness or frontal sinus tenderness.     Mouth/Throat:     Lips: Pink.     Mouth: Mucous membranes are moist.     Pharynx: Postnasal drip present. No pharyngeal swelling, oropharyngeal exudate, posterior oropharyngeal erythema or uvula swelling.     Comments: Cobblestoning present to posterior oropharynx  Eyes:     Extraocular Movements: Extraocular movements intact.     Conjunctiva/sclera: Conjunctivae normal.     Pupils: Pupils are equal, round,  and reactive to light.  Cardiovascular:     Rate and Rhythm: Regular rhythm. Tachycardia present.     Pulses: Normal pulses.     Heart sounds: Normal heart sounds.  Pulmonary:     Effort: Pulmonary effort is normal. No respiratory distress.     Breath sounds: Normal breath sounds. No stridor. No wheezing, rhonchi or rales.  Abdominal:     General: Bowel sounds are normal.     Palpations: Abdomen is soft.     Tenderness: There is no abdominal tenderness.  Musculoskeletal:     Cervical back: Normal range of motion.  Lymphadenopathy:     Cervical: No cervical adenopathy.  Skin:    General: Skin is warm and dry.  Neurological:     General: No focal deficit present.     Mental Status: She is alert and oriented to person, place, and time.  Psychiatric:        Mood and Affect: Mood normal.        Behavior: Behavior normal.      UC Treatments / Results  Labs (all labs ordered are listed, but only abnormal results are displayed) Labs Reviewed  POC COVID19/FLU A&B COMBO    EKG   Radiology No results found.  Procedures Procedures (including critical care time)  Medications Ordered in UC Medications - No data to display  Initial Impression / Assessment and Plan / UC Course  I have reviewed the triage vital signs and the nursing notes.  Pertinent labs & imaging results that were available during my care of the  patient were reviewed by me and considered in my medical decision making (see chart for details).  COVID/flu test is negative.  Chest x-ray is pending given patient's underlying history of asthma and inactive TB.  Patient with influenza-like illness, will treat with Tamiflu 75 mg, for her asthma, prednisone 40 mg, an albuterol inhaler and albuterol nebulizer solution was prescribed.  For persistent cough, Promethazine DM was prescribed.  Supportive care recommendations were provided and discussed with the patient to include fluids, rest, over-the-counter analgesics, and  use of a humidifier during sleep.  Patient was advised she will be contacted when the results of the chest x-ray are received.  Discussed indications with patient regarding follow-up.  Patient was in agreement with this plan of care and verbalized understanding.  All questions were answered.  Patient stable for discharge.  Final Clinical Impressions(s) / UC Diagnoses   Final diagnoses:  Influenza-like illness  Viral illness  History of asthma  Inactive tuberculosis of lung     Discharge Instructions      The COVID/flu test was negative.  Despite the negative test result, you are being treated with Tamiflu. Take medication as prescribed. Increase fluids and allow for plenty of rest. May take over-the-counter Tylenol or ibuprofen as needed for pain, fever, or general discomfort. Recommend use of a humidifier at nighttime during sleep and sleeping elevated on pillows while cough symptoms persist. May use normal saline nasal spray throughout the day for nasal congestion and runny nose. You should remain home until you have been fever free for 24 hours with no medication. Symptoms should improve over the next 5 to 7 days.  If symptoms fail to improve, or appear to be worsening, you may follow-up in this clinic or with your primary care physician for further evaluation. Follow-up as needed.     ED Prescriptions     Medication Sig Dispense Auth. Provider   oseltamivir (TAMIFLU) 75 MG capsule Take 1 capsule (75 mg total) by mouth every 12 (twelve) hours. 10 capsule Leath-Warren, Sadie Haber, NP   fluticasone (FLONASE) 50 MCG/ACT nasal spray Place 2 sprays into both nostrils daily. 16 g Leath-Warren, Sadie Haber, NP   promethazine-dextromethorphan (PROMETHAZINE-DM) 6.25-15 MG/5ML syrup Take 5 mLs by mouth 4 (four) times daily as needed. 118 mL Leath-Warren, Sadie Haber, NP   albuterol (VENTOLIN HFA) 108 (90 Base) MCG/ACT inhaler Inhale 2 puffs into the lungs every 6 (six) hours as needed. 8 g  Leath-Warren, Sadie Haber, NP   albuterol (PROVENTIL) (2.5 MG/3ML) 0.083% nebulizer solution Take 3 mLs (2.5 mg total) by nebulization every 6 (six) hours as needed for wheezing or shortness of breath. 75 mL Leath-Warren, Sadie Haber, NP      PDMP not reviewed this encounter.   Abran Cantor, NP 09/06/23 [phone]

## 2023-09-06 NOTE — Telephone Encounter (Signed)
 Call patient to discuss chest x-ray result.  Spoke with patient, verified 2 patient identifiers.  Patient was advised that there was no active cardiopulmonary disease.  Patient was advised of the finding of the interstitial thickening, patient verified that she was a previous smoker.  Patient did ask if this provider was going to send in prednisone, which was admitted, advised patient that medication will be sent.  Patient was in agreement with this plan of care and verbalized understanding.  All questions were answered.  Will send prednisone 40 mg to patient's preferred pharmacy.

## 2023-09-07 ENCOUNTER — Other Ambulatory Visit (HOSPITAL_COMMUNITY): Payer: Self-pay

## 2023-09-07 ENCOUNTER — Other Ambulatory Visit (HOSPITAL_BASED_OUTPATIENT_CLINIC_OR_DEPARTMENT_OTHER): Payer: Self-pay

## 2023-09-29 ENCOUNTER — Encounter (HOSPITAL_COMMUNITY): Payer: Self-pay | Admitting: Emergency Medicine

## 2023-09-29 ENCOUNTER — Emergency Department (HOSPITAL_COMMUNITY)
Admission: EM | Admit: 2023-09-29 | Discharge: 2023-09-29 | Disposition: A | Discharge status: Home / Self Care | Attending: Emergency Medicine | Admitting: Emergency Medicine

## 2023-09-29 ENCOUNTER — Other Ambulatory Visit: Payer: Self-pay

## 2023-09-29 ENCOUNTER — Emergency Department (HOSPITAL_COMMUNITY)

## 2023-09-29 DIAGNOSIS — H547 Unspecified visual loss: Secondary | ICD-10-CM | POA: Insufficient documentation

## 2023-09-29 DIAGNOSIS — S0231XA Fracture of orbital floor, right side, initial encounter for closed fracture: Secondary | ICD-10-CM | POA: Diagnosis not present

## 2023-09-29 DIAGNOSIS — G43909 Migraine, unspecified, not intractable, without status migrainosus: Secondary | ICD-10-CM | POA: Diagnosis not present

## 2023-09-29 DIAGNOSIS — E041 Nontoxic single thyroid nodule: Secondary | ICD-10-CM | POA: Diagnosis not present

## 2023-09-29 DIAGNOSIS — H543 Unqualified visual loss, both eyes: Secondary | ICD-10-CM

## 2023-09-29 DIAGNOSIS — Z91040 Latex allergy status: Secondary | ICD-10-CM | POA: Insufficient documentation

## 2023-09-29 DIAGNOSIS — Z853 Personal history of malignant neoplasm of breast: Secondary | ICD-10-CM | POA: Insufficient documentation

## 2023-09-29 LAB — BASIC METABOLIC PANEL
Anion gap: 11 (ref 5–15)
BUN: 14 mg/dL (ref 6–20)
CO2: 22 mmol/L (ref 22–32)
Calcium: 8.5 mg/dL — ABNORMAL LOW (ref 8.9–10.3)
Chloride: 107 mmol/L (ref 98–111)
Creatinine, Ser: 0.67 mg/dL (ref 0.44–1.00)
GFR, Estimated: >60 mL/min (ref >60)
Glucose, Bld: 98 mg/dL (ref 70–99)
Potassium: 3.6 mmol/L (ref 3.5–5.1)
Sodium: 140 mmol/L (ref 135–145)

## 2023-09-29 LAB — CBC WITH DIFFERENTIAL/PLATELET
Abs Immature Granulocytes: 0.03 10*3/uL (ref 0.00–0.07)
Basophils Absolute: 0 10*3/uL (ref 0.0–0.1)
Basophils Relative: 1 %
Eosinophils Absolute: 0.1 10*3/uL (ref 0.0–0.5)
Eosinophils Relative: 1 %
HCT: 40 % (ref 36.0–46.0)
Hemoglobin: 13.6 g/dL (ref 12.0–15.0)
Immature Granulocytes: 0 %
Lymphocytes Relative: 25 %
Lymphs Abs: 1.9 10*3/uL (ref 0.7–4.0)
MCH: 32.3 pg (ref 26.0–34.0)
MCHC: 34 g/dL (ref 30.0–36.0)
MCV: 95 fL (ref 80.0–100.0)
Monocytes Absolute: 0.5 10*3/uL (ref 0.1–1.0)
Monocytes Relative: 7 %
Neutro Abs: 4.9 10*3/uL (ref 1.7–7.7)
Neutrophils Relative %: 66 %
Platelets: 292 10*3/uL (ref 150–400)
RBC: 4.21 MIL/uL (ref 3.87–5.11)
RDW: 14.1 % (ref 11.5–15.5)
WBC: 7.4 10*3/uL (ref 4.0–10.5)
nRBC: 0 % (ref 0.0–0.2)

## 2023-09-29 LAB — C-REACTIVE PROTEIN: CRP: 0.6 mg/dL (ref <1.0)

## 2023-09-29 LAB — SEDIMENTATION RATE: Sed Rate: 1 mm/h (ref 0–22)

## 2023-09-29 MED ORDER — IOHEXOL 350 MG/ML SOLN
75.0000 mL | Freq: Once | INTRAVENOUS | Status: AC | PRN
  Administered 2023-09-29: 75 mL via INTRAVENOUS

## 2023-09-29 MED ORDER — TOPIRAMATE 25 MG PO TABS
ORAL_TABLET | ORAL | 0 refills | Status: DC
  Filled 2023-10-26: qty 22, 5d supply, fill #0

## 2023-09-29 NOTE — ED Notes (Signed)
 Per EDP- hold on visual acuity screening,

## 2023-09-29 NOTE — Discharge Instructions (Signed)
 The workup in the emergency room is reassuring.  We have put in referral for neurology.  Please start taking Topamax as prescribed.  Please note that Topamax is titrated up slowly.  Return to the ER if you have worsening vision loss or any additional associated symptoms such as slurring of the speech, balance issues, difficulty in swallowing, one-sided weakness, numbness.

## 2023-09-29 NOTE — ED Notes (Signed)
 Pt to CT
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
## 2023-09-29 NOTE — ED Notes (Signed)
 Pt d/c home per EDP order. Discharge summary reviewed, pt verbalizes understanding. NAD . Ambulatory off unit

## 2023-10-08 ENCOUNTER — Telehealth: Payer: Self-pay

## 2023-10-08 NOTE — Telephone Encounter (Signed)
error 

## 2023-10-14 ENCOUNTER — Encounter: Payer: Self-pay | Admitting: Neurology

## 2023-10-14 ENCOUNTER — Ambulatory Visit: Admitting: Neurology

## 2023-10-14 VITALS — BP 110/79 | HR 90 | Ht 65.0 in | Wt 162.0 lb

## 2023-10-14 DIAGNOSIS — G43109 Migraine with aura, not intractable, without status migrainosus: Secondary | ICD-10-CM

## 2023-10-14 NOTE — Progress Notes (Signed)
 [facility]    Provider:  Dr Lucia Gaskins Requesting Provider: Derwood Kaplan, MD Primary Care Provider:  Carren Rang, PA-C  CC:  Migraines and vision loss  HPI:  Tammy Johnson is a 43 y.o. female here as requested by Derwood Kaplan, MD for migraines. 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 43 year old female comes in with chief complaint of loss of vision. Patient has history of migraines.  She has remote history of breast cancer.  Last year she had a mechanical fall leading to orbital blowout fracture and peripheral vision loss on the right side.  She is supposed to follow-up with neuro-ophthalmology at [facility] in April. From a review of records, she has reported headaches, blurred vision, dizziness since at least 11/28/2022 seen by telemedicine [facility] after mechanical fall 11/15/2022 (records danielle carter), She was seen in the ED on 11/15/22 after mechanical fall/trip. She hit the right side of her face on a piece of metal strip on the floor. She was knocked unconscious and EMS evaluated her. She took herself to the urgent care first, and was sent to the ED due to facial/head trauma. She had CT (maxillofacial and head) performed which showed an acute fracture of the right orbital floor wirth right periorbital and facial soft tissue swelling/hematoma. She was not found to have any clinical entrapment of the rectus muscle. She was treated with antibiotics and referred to [facility]. She saw [facility] on 5/13 who referred her to a plastic surgeon (appt 6/13). She was advised that the  blurred vision in her right eye would improve once the swelling improved. She returned back to work on 5/20 and she developed headaches, dizziness, and nausea.   She has had migraines even prior to fall. She has imitrex for migraines. She saw neurology in 2015. She has a hx of blurred vision after straining all day in both eye. She had episodes in one eye would lose vision in the right eye, would happen and go away, would last seconds to a minute very brief happening at work, she kept going would not affect her work, she was driving and she was on the way to work she had glare from headlights and she had the same symptoms she was safe but it was scary then she fell and she went to the emergency room. No dizziness, no syncope, no chest pain, no immediate headache but later, in the setting of headaches, she felt like she couldn't see anything, she is seeing neuroopthalmology April 30th, she feels her job doesn't understand, she was started on Topiramate 09/29/2023 25mg  would be unusual for a migraine. She used to see dots that obstructed her vision but these episodes of unusual for migraine aura. Her migraines are debilitation, she shuts down, no light or stimulation, a dark quiet room hurts, movement makes it worse, pulsating/pounding/throbbing, light and sound sensitivity, can have associated pressure in the back of the head, imitrex helps, she has only had 2 headaches since starting the topiramate, she has maybe 4 migraine days a month, daily headaches.   Reviewed notes, labs and imaging from outside physicians, which showed:  Medications tried that can be used in migraine/headache management > 3 months: lifestyle  modification, tylenol, asa, flexeril, steroids, gabapentin, ibuprofen, toradol, reglan, naproxen, zofran, phenergan, topamax, imitrex,   04/10/2023: CLINICAL DATA:  Provided history: Traumatic optic neuropathy. Additional history provided by the scanning technologist: The patient reports suffering  a fall in May of 2024 with resultant orbital blowout fracture and loss of peripheral vision.   EXAM: MRI HEAD AND ORBITS WITHOUT AND WITH CONTRAST   TECHNIQUE: Multiplanar, multiecho pulse sequences of the brain and surrounding structures were obtained without and with intravenous contrast. Multiplanar, multiecho pulse sequences of the orbits and surrounding structures were obtained including fat saturation techniques, before and after intravenous contrast administration.   CONTRAST:  7.5mL GADAVIST GADOBUTROL 1 MMOL/ML IV SOLN   COMPARISON:  Brain MRI 05/10/2013. CT examinations of the head and orbits 11/15/2022.   FINDINGS: MRI HEAD FINDINGS   Brain:   Cerebral volume is normal.   Tiny nonspecific T2 FLAIR hyperintense chronic insult within the posterior right frontal lobe white matter, new from the prior brain MRI of 05/10/2013 (series 11, image 36).   No cortical encephalomalacia is identified.   There is no acute infarct.   No evidence of an intracranial mass.   No chronic intracranial blood products.   No extra-axial fluid collection.   No midline shift.   No pathologic intracranial enhancement identified.   Vascular: Maintained flow voids within the proximal large arterial vessels.   Skull and upper cervical spine: No focal suspicious marrow lesion.   MRI ORBITS FINDINGS   Orbits: Chronic fracture deformity of the right orbital floor with 3 mm of depression and involvement of the infraorbital foramen (for instance as seen on series 15, image 17). The globes are normal in size and contour. The extraocular muscles, optic nerve sheath complexes and lacrimal glands are symmetric and unremarkable. No orbital mass.   Visualized sinuses: No significant paranasal sinus disease.   Soft tissues: Unremarkable appearance of the included maxillofacial soft tissues.   IMPRESSION: MRI brain:   1. No evidence of an acute intracranial abnormality. 2. Tiny  nonspecific T2 FLAIR hyperintense chronic insult within the posterior right frontal lobe white matter, new from the prior brain MRI of 05/10/2013. Otherwise unremarkable MRI appearance of the brain.   MRI orbits:   1. Chronic fracture deformity of the right orbital floor with 3 mm of depression and involvement of the infraorbital foramen. 2. Otherwise unremarkable MRI appearance of the orbits.  09/29/2023: CLINICAL DATA:  43 year old female with episodes of recurrent vision loss. Orbital blowout fracture in May of 2024.   EXAM: CT ANGIOGRAPHY HEAD AND NECK WITH AND WITHOUT CONTRAST   TECHNIQUE: Multidetector CT imaging of the head and neck was performed using the standard protocol during bolus administration of intravenous contrast. Multiplanar CT image reconstructions and MIPs were obtained to evaluate the vascular anatomy. Carotid stenosis measurements (when applicable) are obtained utilizing NASCET criteria, using the distal internal carotid diameter as the denominator.   RADIATION DOSE REDUCTION: This exam was performed according to the departmental dose-optimization program which includes automated exposure control, adjustment of the mA and/or kV according to patient size and/or use of iterative reconstruction technique.   CONTRAST:  75mL OMNIPAQUE IOHEXOL 350 MG/ML SOLN   COMPARISON:  Brain and orbit MRI 04/10/2023.   FINDINGS: CT HEAD   Brain: Normal cerebral volume. No midline shift, ventriculomegaly, mass effect, evidence of mass lesion, intracranial hemorrhage or evidence of cortically based acute infarction. Gray-white matter differentiation is within normal limits throughout the brain.   Calvarium and skull base: Partially visible  right orbital floor asymmetry. No acute osseous abnormality identified.   Paranasal sinuses: Visualized paranasal sinuses and mastoids are stable and well aerated.   Orbits: Visualized orbits and scalp soft tissues are within  normal limits.   CTA NECK   Skeleton: Right orbital floor fracture. Minimal herniated fat on series 12, image 57. No acute osseous abnormality identified.   Upper chest: Negative.   Other neck: Subcentimeter right thyroid nodule (no follow-up imaging recommended). Asymmetric but postinflammatory appearing calcifications of the right palatine tonsil. Tonsillar tissue appeared within normal limits by MRI in October. No cervical lymphadenopathy and other neck soft tissue spaces are within normal limits   Aortic arch: Normal 3 vessel arch.   Right carotid system: Normal.   Left carotid system: Normal.   Vertebral arteries: Proximal subclavian arteries and vertebral artery origins are normal. Codominant vertebral arteries appear patent and normal.   CTA HEAD   Posterior circulation: Distal vertebral arteries, vertebrobasilar junction, PICA origins are normal. Patent basilar artery without stenosis. Patent SCA and PCA origins. Left posterior communicating artery is present, the right is diminutive or absent. Bilateral PCA branches are within normal limits.   Anterior circulation: Both ICA siphons appear patent and normal. Normal left posterior communicating artery origin. Bilateral ophthalmic arteries are enhancing (series 10, image 249 on the left and image 241 on the right). Patent carotid termini. Normal MCA and ACA origins. Dominant right and diminutive left ACA A1 segments. Anterior communicating artery and bilateral ACA branches are within normal limits. MCA M1 segments and bifurcations are patent and within normal limits. Bilateral MCA branches are within normal limits.   Venous sinuses: Patent.   Anatomic variants: Fetal type left PCA origin.  Dominant right ACA.   Review of the MIP images confirms the above findings   IMPRESSION: 1. Normal arterial findings on CTA Head and Neck. 2.  Normal CT appearance of the brain. 3. Small chronic right orbital floor  fracture.  Recent Results (from the past 2160 hours)  POC Covid19/Flu A&B Antigen     Status: None   Collection Time: 09/06/23 11:30 AM  Result Value Ref Range   Influenza A Antigen, POC Negative Negative   Influenza B Antigen, POC Negative Negative   Covid Antigen, POC Negative Negative  Basic metabolic panel     Status: Abnormal   Collection Time: 09/29/23  1:25 PM  Result Value Ref Range   Sodium 140 135 - 145 mmol/L   Potassium 3.6 3.5 - 5.1 mmol/L   Chloride 107 98 - 111 mmol/L   CO2 22 22 - 32 mmol/L   Glucose, Bld 98 70 - 99 mg/dL    Comment: Glucose reference range applies only to samples taken after fasting for at least 8 hours.   BUN 14 6 - 20 mg/dL   Creatinine, Ser 1.61 0.44 - 1.00 mg/dL   Calcium 8.5 (L) 8.9 - 10.3 mg/dL   GFR, Estimated >09 >60 mL/min    Comment: (NOTE) Calculated using the CKD-EPI Creatinine Equation (2021)    Anion gap 11 5 - 15    Comment: Performed at [facility], [address] [place], [place] [address]  CBC with Differential     Status: None   Collection Time: 09/29/23  1:25 PM  Result Value Ref Range   WBC 7.4 4.0 - 10.5 K/uL   RBC 4.21 3.87 - 5.11 MIL/uL   Hemoglobin 13.6 12.0 - 15.0 g/dL   HCT 81.1 91.4 - 78.2 %   MCV 95.0 80.0 -  100.0 fL   MCH 32.3 26.0 - 34.0 pg   MCHC 34.0 30.0 - 36.0 g/dL   RDW 16.1 09.6 - 04.5 %   Platelets 292 150 - 400 K/uL   nRBC 0.0 0.0 - 0.2 %   Neutrophils Relative % 66 %   Neutro Abs 4.9 1.7 - 7.7 K/uL   Lymphocytes Relative 25 %   Lymphs Abs 1.9 0.7 - 4.0 K/uL   Monocytes Relative 7 %   Monocytes Absolute 0.5 0.1 - 1.0 K/uL   Eosinophils Relative 1 %   Eosinophils Absolute 0.1 0.0 - 0.5 K/uL   Basophils Relative 1 %   Basophils Absolute 0.0 0.0 - 0.1 K/uL   Immature Granulocytes 0 %   Abs Immature Granulocytes 0.03 0.00 - 0.07 K/uL    Comment: Performed at [facility], [address], [place], [place] [address]  Sedimentation rate     Status: None   Collection Time: 09/29/23  1:25 PM  Result  Value Ref Range   Sed Rate 1 0 - 22 mm/hr    Comment: Performed at [facility], [address] [place], [place] [address]  C-reactive protein     Status: None   Collection Time: 09/29/23  1:25 PM  Result Value Ref Range   CRP 0.6 <1.0 mg/dL    Comment: Performed at [facility], [address] [place], [place] [address]     Review of Systems: Patient complains of symptoms per HPI as well as the following symptoms none. Pertinent negatives and positives per HPI. All others negative.   Social History   Socioeconomic History   Marital status: Married    Spouse name: Not on file   Number of children: Not on file   Years of education: Not on file   Highest education level: Not on file  Occupational History   Not on file  Tobacco Use   Smoking status: Former    Current packs/day: 0.00    Types: Cigarettes    Quit date: 03/18/2014    Years since quitting: 9.5   Smokeless tobacco: Never  Vaping Use   Vaping status: Never Used  Substance and Sexual Activity   Alcohol use: Yes    Comment: Occasional   Drug use: No   Sexual activity: Not on file  Other Topics Concern   Not on file  Social History Narrative   Not on file   Social Drivers of Health   Financial Resource Strain: Low Risk  (01/02/2023)   Received from [facility]   Overall Financial Resource Strain (CARDIA)    Difficulty of Paying Living Expenses: Not hard at all  Food Insecurity: No Food Insecurity (01/02/2023)   Received from [facility]   Hunger Vital Sign    Worried About Running Out of Food in the Last Year: Never true    Ran Out of Food in the Last Year: Never true  Transportation Needs: No Transportation Needs (01/02/2023)   Received from [facility] - Transportation    In the past 12 months, has lack of transportation kept you from medical appointments or from getting medications?: No    Lack of Transportation (Non-Medical): No   Physical Activity: Not on file  Stress: Not on file  Social Connections: Not on file  Intimate Partner Violence: Not on file    Family History  Problem Relation Age of Onset   Cancer Mother  breast    Depression Brother    Bipolar disorder Brother    Schizophrenia Brother    Cervical cancer Maternal Grandmother    Heart disease Maternal Grandmother    Cancer - Colon Maternal Grandmother    Migraines Neg Hx     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   Intractable migraine without aura 01/11/2014   Migraine    Migraines 11/13/2013   TB lung, latent 2014   + TB Skin test; Prophalactic Rofampin    Patient Active Problem List   Diagnosis Date Noted   Asthma 01/23/2022   Closed nondisplaced fracture of cuboid bone of right foot 08/08/2019   Calculus of gallbladder with chronic cholecystitis without obstruction    Umbilical hernia without obstruction and without gangrene    Abdominal pain 06/27/2015   Intractable migraine without aura 01/11/2014   Migraines 11/13/2013   Malignant neoplasm of breast (HCC) 10/18/2013   Calculus of kidney 10/18/2013   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    Past Surgical History:  Procedure Laterality Date   BREAST EXCISIONAL BIOPSY Right 2005 AND 2008   BENIGN   BREAST SURGERY Right 2005   Lumpectomy   CHOLECYSTECTOMY N/A 12/25/2016   Procedure: LAPAROSCOPIC CHOLECYSTECTOMY;  Surgeon: Claudia Cuff, MD;  Location: [facility] ORS;  Service: General;  Laterality: N/A;   LAPAROSCOPIC ENDOMETRIOSIS FULGURATION  1999   RT BREAST MASS     UMBILICAL HERNIA REPAIR  12/25/2016   Procedure: HERNIA REPAIR UMBILICAL ADULT;  Surgeon: Claudia Cuff, MD;  Location: [facility] ORS;   Service: General;;    Current Outpatient Medications  Medication Sig Dispense Refill   albuterol (PROVENTIL) (2.5 MG/3ML) 0.083% nebulizer solution Take 3 mLs (2.5 mg total) by nebulization every 6 (six) hours as needed for wheezing or shortness of breath. 75 mL 0   albuterol (VENTOLIN HFA) 108 (90 Base) MCG/ACT inhaler Inhale 2 puffs into the lungs every 6 (six) hours as needed. 8 g 0   Albuterol-Budesonide (AIRSUPRA) 90-80 MCG/ACT AERO Inhale 2 puffs into the lungs every 4 (four) hours as needed. 10.7 g 5   aspirin-acetaminophen-caffeine (EXCEDRIN EXTRA STRENGTH) 250-250-65 MG tablet Take 1 tablet by mouth every 6 (six) hours as needed for headache.     Budeson-Glycopyrrol-Formoterol (BREZTRI AEROSPHERE) 160-9-4.8 MCG/ACT AERO Inhale 2 puffs into the lungs in the morning and at bedtime. 10.7 g 3   cetirizine (ZYRTEC) 10 MG tablet Take 10 mg by mouth daily as needed for allergies.     Cholecalciferol (VITAMIN D-1000 MAX ST) 25 MCG (1000 UT) tablet Take by mouth.     cyanocobalamin (VITAMIN B12) 1000 MCG tablet Take by mouth.     EPINEPHrine 0.3 mg/0.3 mL IJ SOAJ injection Inject 0.3 mLs (0.3 mg total) into the muscle as needed for Anaphylaxis 2 each 1   fluticasone (FLONASE) 50 MCG/ACT nasal spray Place 2 sprays into both nostrils daily. 16 g 0   ondansetron (ZOFRAN) 4 MG tablet Take 1 tablet (4 mg total) by mouth every 6 (six) hours as needed for nausea. 4 tablet 0   SUMAtriptan (IMITREX) 100 MG tablet Take 1 tablet (100 mg total) by mouth once as needed for Migraine for up to 1 dose May take  a second dose after 2 hours if needed. 9 tablet 1   topiramate (TOPAMAX) 25 MG tablet 25 mg q evening for week 1 25 mg bid for on week 2 25 mg in the AM and 50 mg in the evening week 3 50 mg bid week 4 100 tablet 0   Budeson-Glycopyrrol-Formoterol (BREZTRI AEROSPHERE) 160-9-4.8 MCG/ACT AERO Inhale 2 puffs into the lungs in the morning and at bedtime. (Patient not taking: Reported on 10/14/2023) 10.7 g 0    EPINEPHrine 0.3 mg/0.3 mL IJ SOAJ injection Inject 0.3 mLs (0.3 mg total) into the muscle once. (Patient not taking: Reported on 10/14/2023) 1 Device 1   oseltamivir (TAMIFLU) 75 MG capsule Take 1 capsule (75 mg total) by mouth every 12 (twelve) hours. (Patient not taking: Reported on 10/14/2023) 10 capsule 0   promethazine-dextromethorphan (PROMETHAZINE-DM) 6.25-15 MG/5ML syrup Take 5 mLs by mouth 4 (four) times daily as needed. (Patient not taking: Reported on 10/14/2023) 118 mL 0   No current facility-administered medications for this visit.    Allergies as of 10/14/2023 - Review Complete 10/14/2023  Allergen Reaction Noted   Bee venom Anaphylaxis 10/11/2013   Hibiclens [chlorhexidine gluconate] Rash 01/02/2017   Penicillins Anaphylaxis and Rash 08/24/2011   Clindamycin/lincomycin Hives 05/19/2015   Dapsone Hives, Itching, and Rash 05/19/2015   Latex Swelling and Rash 10/11/2013    Vitals: BP 110/79   Pulse 90   Ht 5\' 5"  (1.651 m)   Wt 162 lb (73.5 kg)   LMP 09/26/2023 (Approximate)   BMI 26.96 kg/m  Last Weight:  Wt Readings from Last 1 Encounters:  10/14/23 162 lb (73.5 kg)   Last Height:   Ht Readings from Last 1 Encounters:  10/14/23 5\' 5"  (1.651 m)     Physical exam: Exam: Gen: NAD, conversant, well nourised, well groomed                     CV: RRR, no MRG. No Carotid Bruits. No peripheral edema, warm, nontender Eyes: Conjunctivae clear without exudates or hemorrhage  Neuro: Detailed Neurologic Exam  Speech:    Speech is normal; fluent and spontaneous with normal comprehension.  Cognition:    The patient is oriented to person, place, and time;     recent and remote memory intact;     language fluent;     normal attention, concentration,     fund of knowledge Cranial Nerves:    The pupils are equal, round, and reactive to light. The fundi are normal and spontaneous venous pulsations are present. Visual fields are full to finger confrontation. Extraocular  movements are intact. Trigeminal sensation is intact and the muscles of mastication are normal. The face is symmetric. The palate elevates in the midline. Hearing intact. Voice is normal. Shoulder shrug is normal. The tongue has normal motion without fasciculations.   Coordination:    Normal finger to nose and heel to shin. Normal rapid alternating movements.   Gait:    Heel-toe and tandem gait are normal.   Motor Observation:    No asymmetry, no atrophy, and no involuntary movements noted. Tone:    Normal muscle tone.    Posture:    Posture is normal. normal erect    Strength:    Strength is V/V in the upper and lower limbs.      Sensation: intact to LT     Reflex Exam:  DTR's:    Deep tendon reflexes in the upper and lower extremities are normal bilaterally.  Toes:    The toes are downgoing bilaterally.   Clonus:    Clonus is absent.    Assessment/Plan:  Patient with migraines and vision loss possibly ocular migraines, Feeling fatigued, traumatic optic neuropathy, visual field loss,   Continue Topiramate  Follow up with neuro-ophthalmology Can continue imitrex as needed We can follow up in 3 months  No orders of the defined types were placed in this encounter.  No orders of the defined types were placed in this encounter.   Cc: Deatra Face, MD,  [place], PA-C  Aldona Amel, MD  [facility] [address] [place], [place] [address]  Phone [phone] Fax [phone]'

## 2023-10-14 NOTE — Patient Instructions (Addendum)
 Continue Topiramate  Follow up with neuro-ophthalmology Can continue imitrex as needed We can follow up in 3 months

## 2023-10-18 ENCOUNTER — Encounter: Payer: Self-pay | Admitting: Neurology

## 2023-10-18 NOTE — Addendum Note (Signed)
 Addended by: Eola Waldrep B on: 10/18/2023 05:01 PM   Modules accepted: Level of Service

## 2023-10-20 NOTE — Telephone Encounter (Signed)
 Signed fmla sent to medical records for processing.

## 2023-10-26 ENCOUNTER — Other Ambulatory Visit (HOSPITAL_BASED_OUTPATIENT_CLINIC_OR_DEPARTMENT_OTHER): Payer: Self-pay

## 2023-10-26 ENCOUNTER — Other Ambulatory Visit: Payer: Self-pay | Admitting: Neurology

## 2023-10-27 ENCOUNTER — Other Ambulatory Visit (HOSPITAL_BASED_OUTPATIENT_CLINIC_OR_DEPARTMENT_OTHER): Payer: Self-pay

## 2023-10-27 MED ORDER — TOPIRAMATE 50 MG PO TABS
50.0000 mg | ORAL_TABLET | Freq: Two times a day (BID) | ORAL | 4 refills | Status: AC
  Filled 2023-11-06: qty 180, 90d supply, fill #0

## 2023-11-02 ENCOUNTER — Telehealth: Payer: Self-pay | Admitting: Neurology

## 2023-11-02 NOTE — Telephone Encounter (Signed)
 LVM and sent mychart msg informing pt of need to reschedule 01/21/24 appt - MD out  If patient calls back to r/s you can offer an appointment at the end of August with Dr. Tresia Fruit

## 2023-11-04 DIAGNOSIS — G43009 Migraine without aura, not intractable, without status migrainosus: Secondary | ICD-10-CM | POA: Diagnosis not present

## 2023-11-04 DIAGNOSIS — S0231XD Fracture of orbital floor, right side, subsequent encounter for fracture with routine healing: Secondary | ICD-10-CM | POA: Diagnosis not present

## 2023-11-06 ENCOUNTER — Other Ambulatory Visit: Payer: Self-pay

## 2023-11-06 ENCOUNTER — Other Ambulatory Visit (HOSPITAL_BASED_OUTPATIENT_CLINIC_OR_DEPARTMENT_OTHER): Payer: Self-pay

## 2023-11-13 DIAGNOSIS — Z1231 Encounter for screening mammogram for malignant neoplasm of breast: Secondary | ICD-10-CM | POA: Diagnosis not present

## 2023-11-16 ENCOUNTER — Other Ambulatory Visit (HOSPITAL_BASED_OUTPATIENT_CLINIC_OR_DEPARTMENT_OTHER): Payer: Self-pay

## 2024-01-21 ENCOUNTER — Ambulatory Visit: Admitting: Neurology

## 2024-04-07 ENCOUNTER — Ambulatory Visit
Admission: RE | Admit: 2024-04-07 | Discharge: 2024-04-07 | Disposition: A | Discharge status: Home / Self Care | Source: Ambulatory Visit | Attending: Nurse Practitioner | Admitting: Nurse Practitioner

## 2024-04-07 VITALS — BP 122/85 | HR 93 | Temp 99.2°F | Resp 18

## 2024-04-07 DIAGNOSIS — B349 Viral infection, unspecified: Secondary | ICD-10-CM

## 2024-04-07 DIAGNOSIS — R112 Nausea with vomiting, unspecified: Secondary | ICD-10-CM | POA: Diagnosis not present

## 2024-04-07 LAB — POC COVID19/FLU A&B COMBO
Covid Antigen, POC: NEGATIVE
Influenza A Antigen, POC: NEGATIVE
Influenza B Antigen, POC: NEGATIVE

## 2024-04-07 MED ORDER — ONDANSETRON 4 MG PO TBDP
4.0000 mg | ORAL_TABLET | Freq: Once | ORAL | Status: AC
  Administered 2024-04-07: 4 mg via ORAL

## 2024-04-07 MED ORDER — ONDANSETRON 4 MG PO TBDP: 4.0000 mg | ORAL_TABLET | Freq: Three times a day (TID) | ORAL | 0 refills | Status: AC | PRN

## 2024-04-07 MED ORDER — BENZONATATE 100 MG PO CAPS: 100.0000 mg | ORAL_CAPSULE | Freq: Three times a day (TID) | ORAL | 0 refills | Status: AC | PRN

## 2024-04-07 NOTE — ED Provider Notes (Signed)
 [facility]    CSN: [id number] Arrival date & time: 04/07/24  1415      History   Chief Complaint Chief Complaint  Patient presents with   Nausea    Sore throat, headache, on and off fevers since yesterday, diarrhea, itchy dry eyes,  stuffy then runny then dry nose, and ear pain. - Entered by patient    HPI Tammy Johnson is a 43 y.o. female.   Patient presents today with 1 day history of fever, Tmax 103 F, congested cough that is worse in the morning and when laying down, runny and stuffy nose, sore throat, headache, bilateral ear pressure, nausea, vomiting, and diarrhea, decreased appetite, and fatigue.  She reports 2 episodes of non blood vomiting and 3-4 episodes of nonbloody watery diarrhea today.  She has been able to keep down sips of fluids.  She denies shortness of breath or chest pain, new rash, and known sick contacts.  She works at a [employer].  She has been taking Tylenol  and Motrin  for the fever, Flonase , and Mucinex DM which helps keep her symptoms at bay.     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   Intractable migraine without aura 01/11/2014   Migraine    Migraines 11/13/2013   TB lung, latent 2014   + TB Skin test; Prophalactic Rofampin    Patient Active Problem List   Diagnosis Date Noted   Asthma 01/23/2022   Closed nondisplaced fracture of cuboid bone of right foot 08/08/2019   Calculus of gallbladder with chronic cholecystitis without obstruction    Umbilical hernia without obstruction and without gangrene    Abdominal pain 06/27/2015   Intractable migraine without aura 01/11/2014   Migraines 11/13/2013   Malignant neoplasm of breast (HCC) 10/18/2013   Calculus of kidney 10/18/2013   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  10/18/2013   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    Past Surgical History:  Procedure Laterality Date   BREAST EXCISIONAL BIOPSY Right 2005 AND 2008   BENIGN   BREAST SURGERY Right 2005   Lumpectomy   CHOLECYSTECTOMY N/A 12/25/2016   Procedure: LAPAROSCOPIC CHOLECYSTECTOMY;  Surgeon: Wonda Charlie BRAVO, MD;  Location: [facility];  Service: General;  Laterality: N/A;   LAPAROSCOPIC ENDOMETRIOSIS FULGURATION  1999   RT BREAST MASS     UMBILICAL HERNIA REPAIR  12/25/2016   Procedure: HERNIA REPAIR UMBILICAL ADULT;  Surgeon: Wonda Charlie BRAVO, MD;  Location: [facility];  Service: General;;    OB History     Gravida  2   Para  2   Term  2   Preterm      AB      Living  2      SAB      IAB      Ectopic      Multiple      Live Births               Home Medications    Prior to Admission medications   Medication Sig Start Date End Date Taking? Authorizing Provider  benzonatate  (TESSALON ) 100 MG capsule Take 1 capsule (100 mg total) by mouth 3 (three) times daily as needed for cough. Do not take with alcohol or while operating or  driving heavy machinery 89/7/74  Yes Chandra Raisin A, NP  ondansetron  (ZOFRAN -ODT) 4 MG disintegrating tablet Take 1 tablet (4 mg total) by mouth every 8 (eight) hours as needed for nausea or vomiting. 04/07/24  Yes Chandra Raisin LABOR, NP  albuterol  (PROVENTIL ) (2.5 MG/3ML) 0.083% nebulizer solution Take 3 mLs (2.5 mg total) by nebulization every 6 (six) hours as needed for wheezing or shortness of breath. 09/06/23   Leath-Warren, Etta PARAS, NP  albuterol  (VENTOLIN  HFA) 108 (90 Base) MCG/ACT inhaler Inhale 2 puffs into the lungs every 6 (six) hours as needed. 09/06/23   Leath-Warren, Etta PARAS, NP  Albuterol -Budesonide  (AIRSUPRA ) 90-80 MCG/ACT AERO Inhale 2 puffs into the lungs every 4 (four) hours as needed. 08/01/22   Kassie Acquanetta Bradley, MD  aspirin-acetaminophen -caffeine (EXCEDRIN EXTRA STRENGTH) 250-250-65 MG tablet Take 1 tablet by mouth  every 6 (six) hours as needed for headache.    [provider]  Budeson-Glycopyrrol-Formoterol  (BREZTRI  AEROSPHERE) 160-9-4.8 MCG/ACT AERO Inhale 2 puffs into the lungs in the morning and at bedtime. 02/25/22   Neda Jennet LABOR, MD  Budeson-Glycopyrrol-Formoterol  (BREZTRI  AEROSPHERE) 160-9-4.8 MCG/ACT AERO Inhale 2 puffs into the lungs in the morning and at bedtime. Patient not taking: Reported on 10/14/2023 02/25/22   Neda Jennet LABOR, MD  cetirizine (ZYRTEC) 10 MG tablet Take 10 mg by mouth daily as needed for allergies.    [provider]  Cholecalciferol (VITAMIN D-1000 MAX ST) 25 MCG (1000 UT) tablet Take by mouth.    [provider]  cyanocobalamin (VITAMIN B12) 1000 MCG tablet Take by mouth.    [provider]  EPINEPHrine  0.3 mg/0.3 mL IJ SOAJ injection Inject 0.3 mLs (0.3 mg total) into the muscle once. Patient not taking: Reported on 10/14/2023 04/10/15   Garrick Charleston, MD  EPINEPHrine  0.3 mg/0.3 mL IJ SOAJ injection Inject 0.3 mLs (0.3 mg total) into the muscle as needed for Anaphylaxis 11/13/22     fluticasone  (FLONASE ) 50 MCG/ACT nasal spray Place 2 sprays into both nostrils daily. 09/06/23   Leath-Warren, Etta PARAS, NP  ondansetron  (ZOFRAN ) 4 MG tablet Take 1 tablet (4 mg total) by mouth every 6 (six) hours as needed for nausea. 11/16/16   Raford Lenis, MD  promethazine -dextromethorphan (PROMETHAZINE -DM) 6.25-15 MG/5ML syrup Take 5 mLs by mouth 4 (four) times daily as needed. Patient not taking: Reported on 10/14/2023 09/06/23   Leath-Warren, Etta PARAS, NP  SUMAtriptan  (IMITREX ) 100 MG tablet Take 1 tablet (100 mg total) by mouth once as needed for Migraine for up to 1 dose May take a second dose after 2 hours if needed. 12/30/21     topiramate  (TOPAMAX ) 50 MG tablet Take 1 tablet (50 mg total) by mouth 2 (two) times daily. 10/27/23   Ines Onetha NOVAK, MD    Family History Family History  Problem Relation Age of Onset   Cancer Mother        breast     Depression Brother    Bipolar disorder Brother    Schizophrenia Brother    Cervical cancer Maternal Grandmother    Heart disease Maternal Grandmother    Cancer - Colon Maternal Grandmother    Migraines Neg Hx     Social History Social History   Tobacco Use   Smoking status: Former    Current packs/day: 0.00    Types: Cigarettes    Quit date: 03/18/2014    Years since quitting: 10.0   Smokeless tobacco: Never  Vaping Use   Vaping status: Never Used  Substance Use Topics  Alcohol use: Yes    Comment: Occasional   Drug use: No     Allergies   Bee venom, Hibiclens  [chlorhexidine  gluconate], Penicillins, Clindamycin/lincomycin, Dapsone, and Latex   Review of Systems Review of Systems Per HPI  Physical Exam Triage Vital Signs ED Triage Vitals  Encounter Vitals Group     BP 04/07/24 1427 122/85     Girls Systolic BP Percentile --      Girls Diastolic BP Percentile --      Boys Systolic BP Percentile --      Boys Diastolic BP Percentile --      Pulse Rate 04/07/24 1427 93     Resp 04/07/24 1427 18     Temp 04/07/24 1427 99.2 F (37.3 C)     Temp Source 04/07/24 1427 Oral     SpO2 04/07/24 1427 97 %     Weight --      Height --      Head Circumference --      Peak Flow --      Pain Score 04/07/24 1430 7     Pain Loc --      Pain Education --      Exclude from Growth Chart --    No data found.  Updated Vital Signs BP 122/85 (BP Location: Right Arm)   Pulse 93   Temp 99.2 F (37.3 C) (Oral)   Resp 18   SpO2 97%   Visual Acuity Right Eye Distance:   Left Eye Distance:   Bilateral Distance:    Right Eye Near:   Left Eye Near:    Bilateral Near:     Physical Exam Vitals and nursing note reviewed.  Constitutional:      General: She is not in acute distress.    Appearance: Normal appearance. She is not ill-appearing or toxic-appearing.  HENT:     Head: Normocephalic and atraumatic.     Right Ear: Ear canal and external ear normal. A middle ear  effusion is present.     Left Ear: Ear canal and external ear normal. A middle ear effusion is present.     Nose: No congestion or rhinorrhea.     Mouth/Throat:     Mouth: Mucous membranes are moist.     Pharynx: Oropharynx is clear. Posterior oropharyngeal erythema present. No oropharyngeal exudate.  Eyes:     General: No scleral icterus.    Extraocular Movements: Extraocular movements intact.  Cardiovascular:     Rate and Rhythm: Normal rate and regular rhythm.  Pulmonary:     Effort: Pulmonary effort is normal. No respiratory distress.     Breath sounds: Normal breath sounds. No wheezing, rhonchi or rales.  Abdominal:     General: Abdomen is flat. Bowel sounds are increased.     Palpations: Abdomen is soft.     Tenderness: There is no abdominal tenderness. There is no right CVA tenderness, left CVA tenderness, guarding or rebound.  Musculoskeletal:     Cervical back: Normal range of motion and neck supple.  Lymphadenopathy:     Cervical: No cervical adenopathy.  Skin:    General: Skin is warm and dry.     Coloration: Skin is not jaundiced or pale.     Findings: No erythema or rash.  Neurological:     Mental Status: She is alert and oriented to person, place, and time.  Psychiatric:        Behavior: Behavior is cooperative.      UC Treatments /  Results  Labs (all labs ordered are listed, but only abnormal results are displayed) Labs Reviewed  POC COVID19/FLU A&B COMBO - Normal    EKG   Radiology No results found.  Procedures Procedures (including critical care time)  Medications Ordered in UC Medications  ondansetron  (ZOFRAN -ODT) disintegrating tablet 4 mg (4 mg Oral Given 04/07/24 1521)    Initial Impression / Assessment and Plan / UC Course  I have reviewed the triage vital signs and the nursing notes.  Pertinent labs & imaging results that were available during my care of the patient were reviewed by me and considered in my medical decision making (see  chart for details).   Patient is well-appearing, normotensive, afebrile, not tachycardic, not tachypneic, oxygenating well on room air.   1. Viral illness 2. Nausea and vomiting, unspecified vomiting type Suspect viral etiology Vitals and exam are reassuring today COVID-19 and influenza testing is negative today Patient was given Zofran  4 mg ODT in urgent care and was able to tolerate oral fluids shortly thereafter Supportive care discussed with patient, start Zofran  as needed at home and push fluids and start cough suppressant medication ER and return precautions discussed Note given for work  The patient was given the opportunity to ask questions.  All questions answered to their satisfaction.  The patient is in agreement to this plan.   Final Clinical Impressions(s) / UC Diagnoses   Final diagnoses:  Viral illness  Nausea and vomiting, unspecified vomiting type     Discharge Instructions      You have a viral upper respiratory infection.  Symptoms should improve over the next week to 10 days.  If you develop chest pain or shortness of breath, go to the emergency room.  COVID-19 and influenza testing is negative today.  Some things that can make you feel better are: - Increased rest - Increasing fluid with water/sugar free electrolytes - Acetaminophen  and ibuprofen  as needed for fever/pain - Salt water gargling, chloraseptic spray and throat lozenges - OTC guaifenesin (Mucinex) 600 mg twice daily - Saline sinus flushes or a neti pot - Humidifying the air -Tessalon  Perles every 8 hours as needed for dry cough  -Zofran  every 8 hours as needed for nausea/vomiting     ED Prescriptions     Medication Sig Dispense Auth. Provider   ondansetron  (ZOFRAN -ODT) 4 MG disintegrating tablet Take 1 tablet (4 mg total) by mouth every 8 (eight) hours as needed for nausea or vomiting. 20 tablet Chandra Raisin A, NP   benzonatate  (TESSALON ) 100 MG capsule Take 1 capsule (100 mg  total) by mouth 3 (three) times daily as needed for cough. Do not take with alcohol or while operating or driving heavy machinery 21 capsule Chandra Raisin LABOR, NP      PDMP not reviewed this encounter.   Chandra Raisin LABOR, NP 04/07/24 [phone]

## 2024-04-07 NOTE — ED Triage Notes (Addendum)
 Pt reports sore throat, fever, nausea vomiting, onset yesterday . Has been rotation between tylenol  and motrin .

## 2024-04-07 NOTE — Discharge Instructions (Addendum)
 You have a viral upper respiratory infection.  Symptoms should improve over the next week to 10 days.  If you develop chest pain or shortness of breath, go to the emergency room.  COVID-19 and influenza testing is negative today.  Some things that can make you feel better are: - Increased rest - Increasing fluid with water/sugar free electrolytes - Acetaminophen  and ibuprofen  as needed for fever/pain - Salt water gargling, chloraseptic spray and throat lozenges - OTC guaifenesin (Mucinex) 600 mg twice daily - Saline sinus flushes or a neti pot - Humidifying the air -Tessalon  Perles every 8 hours as needed for dry cough  -Zofran  every 8 hours as needed for nausea/vomiting

## 2024-04-07 NOTE — ED Notes (Signed)
 Pt states she was able to keep fluids down.
